# Patient Record
Sex: Female | Born: 1980
Health system: Southern US, Community
[De-identification: ages and names within clinical notes are randomized; demographics above are authoritative.]

## PROBLEM LIST (undated history)

## (undated) DIAGNOSIS — B977 Papillomavirus as the cause of diseases classified elsewhere: Secondary | ICD-10-CM

## (undated) DIAGNOSIS — E785 Hyperlipidemia, unspecified: Secondary | ICD-10-CM

## (undated) DIAGNOSIS — K219 Gastro-esophageal reflux disease without esophagitis: Secondary | ICD-10-CM

## (undated) DIAGNOSIS — M797 Fibromyalgia: Secondary | ICD-10-CM

## (undated) DIAGNOSIS — G8929 Other chronic pain: Secondary | ICD-10-CM

## (undated) DIAGNOSIS — R7301 Impaired fasting glucose: Secondary | ICD-10-CM

## (undated) DIAGNOSIS — R600 Localized edema: Secondary | ICD-10-CM

## (undated) DIAGNOSIS — I10 Essential (primary) hypertension: Secondary | ICD-10-CM

## (undated) DIAGNOSIS — E282 Polycystic ovarian syndrome: Secondary | ICD-10-CM

## (undated) DIAGNOSIS — N920 Excessive and frequent menstruation with regular cycle: Secondary | ICD-10-CM

## (undated) DIAGNOSIS — L680 Hirsutism: Secondary | ICD-10-CM

## (undated) DIAGNOSIS — A159 Respiratory tuberculosis unspecified: Secondary | ICD-10-CM

## (undated) HISTORY — DX: Impaired fasting glucose: R73.01

## (undated) HISTORY — DX: Hyperlipidemia, unspecified: E78.5

## (undated) HISTORY — DX: Hirsutism: L68.0

## (undated) HISTORY — PX: GASTRECTOMY: SHX58

## (undated) HISTORY — PX: LAPAROSCOPIC GASTRIC SLEEVE RESECTION: SHX5895

## (undated) HISTORY — DX: Polycystic ovarian syndrome: E28.2

## (undated) HISTORY — DX: Localized edema: R60.0

## (undated) HISTORY — DX: Essential (primary) hypertension: I10

## (undated) HISTORY — PX: CHOLECYSTECTOMY: SHX55

## (undated) HISTORY — PX: ABDOMINAL SURGERY: SHX537

## (undated) HISTORY — DX: Respiratory tuberculosis unspecified: A15.9

## (undated) HISTORY — DX: Papillomavirus as the cause of diseases classified elsewhere: B97.7

## (undated) HISTORY — DX: Gastro-esophageal reflux disease without esophagitis: K21.9

## (undated) HISTORY — DX: Excessive and frequent menstruation with regular cycle: N92.0

---

## 2012-04-28 ENCOUNTER — Encounter: Payer: Self-pay | Admitting: *Deleted

## 2012-04-30 ENCOUNTER — Ambulatory Visit: Payer: Self-pay | Admitting: Family Medicine

## 2012-05-04 ENCOUNTER — Encounter: Payer: Self-pay | Admitting: Family Medicine

## 2012-05-04 ENCOUNTER — Ambulatory Visit (INDEPENDENT_AMBULATORY_CARE_PROVIDER_SITE_OTHER): Payer: Managed Care, Other (non HMO) | Admitting: Family Medicine

## 2012-05-04 VITALS — BP 115/84 | HR 84 | Wt 226.0 lb

## 2012-05-04 DIAGNOSIS — R110 Nausea: Secondary | ICD-10-CM | POA: Insufficient documentation

## 2012-05-04 DIAGNOSIS — E669 Obesity, unspecified: Secondary | ICD-10-CM | POA: Insufficient documentation

## 2012-05-04 MED ORDER — AMBULATORY NON FORMULARY MEDICATION: Status: DC

## 2012-05-04 NOTE — Patient Instructions (Addendum)
Take 1 tablespoon of the GI cocktail 30 mins prior to drinking a protein shake.     High Protein Diet A high protein diet means that high protein foods are added to your diet. Getting more protein in the diet is important for a number of reasons. Protein helps the body to build tissue, muscle, and to repair damage. People who have had surgery, injuries such as broken bones, infections, and burns, or illnesses such as cancer, may need more protein in their diet.  SERVING SIZES Measuring foods and serving sizes helps to make sure you are getting the right amount of food. The list below tells how big or small some common serving sizes are.   1 oz.........4 stacked dice.  3 oz........Marland KitchenDeck of cards.  1 tsp.......Marland KitchenTip of little finger.  1 tbs......Marland KitchenMarland KitchenThumb.  2 tbs.......Marland KitchenGolf ball.   cup......Marland KitchenHalf of a fist.  1 cup.......Marland KitchenA fist. FOOD SOURCES OF PROTEIN Listed below are some food sources of protein and the amount of protein they contain. Your Registered Dietitian can calculate how many grams of protein you need for your medical condition. High protein foods can be added to the diet at mealtime or as snacks. Be sure to have at least 1 protein-containing food at each meal and snack to ensure adequate intake.  Meats and Meat Substitutes / Protein (g)  3 oz poultry (chicken, [place]) / 26 g  3 oz tuna, canned in water / 26 g  3 oz fish (cod) / 21 g  3 oz red meat (beef, pork) / 21 g  4 oz tofu / 9 g  1 egg / 6 g   cup egg substitute / 5 g  1 cup dried beans / 15 g  1 cup soy milk / 4 g Dairy / Protein (g)  1 cup milk (skim, 1%, 2%, whole) / 8 g   cup evaporated milk / 9 g  1 cup buttermilk / 8 g  1 cup low-fat plain yogurt / 11 g  1 cup regular plain yogurt / 9 g   cup cottage cheese / 14 g  1 oz cheddar cheese / 7 g Nuts / Protein (g)  2 tbs peanut butter / 8 g  1 oz peanuts / 7 g  2 tbs cashews / 5 g  2 tbs almonds / 5 g Document Released: 01/28/2005  Document Revised: 04/22/2011 Document Reviewed: 10/31/2006 [facility] Patient Information 2013 [place], [place].

## 2012-05-04 NOTE — Progress Notes (Signed)
Subjective:     Patient ID: Paige Hamilton, female   DOB: Nov 05, 1980, 32 y.o.   MRN: [id number]  HPI [place] is in for a follow up from her sleeve gastrectomy.  Overall, she has done okay.  She had a harder time than she thought she would. She was hospitalized for 4 days. She has struggled with nausea and has been unable to get in the amount of fluid that she needs.  She is feeling very weak and has been out of work for 3 weeks.    Review of Systems  Constitutional: Positive for fatigue.  Gastrointestinal: Positive for nausea.       Objective:   Physical Exam  Skin:  Her incisions are healing well.    Psychiatric: She has a normal mood and affect.       Assessment:    Nausea  Fatigue     Plan:     We discussed how she needs to get in protein.  She was given a prescription for GI cocktail.

## 2012-05-05 ENCOUNTER — Ambulatory Visit: Payer: Self-pay | Admitting: Family Medicine

## 2012-08-03 ENCOUNTER — Other Ambulatory Visit: Payer: Managed Care, Other (non HMO)

## 2012-08-10 ENCOUNTER — Other Ambulatory Visit: Payer: Managed Care, Other (non HMO) | Admitting: Family Medicine

## 2012-08-11 ENCOUNTER — Other Ambulatory Visit: Payer: Managed Care, Other (non HMO) | Admitting: Family Medicine

## 2012-08-25 ENCOUNTER — Other Ambulatory Visit: Payer: Self-pay | Admitting: *Deleted

## 2012-08-25 DIAGNOSIS — Z0000 Encounter for general adult medical examination without abnormal findings: Secondary | ICD-10-CM

## 2012-08-25 DIAGNOSIS — E559 Vitamin D deficiency, unspecified: Secondary | ICD-10-CM

## 2012-08-26 ENCOUNTER — Other Ambulatory Visit: Payer: Managed Care, Other (non HMO)

## 2012-09-02 ENCOUNTER — Other Ambulatory Visit: Payer: Managed Care, Other (non HMO) | Admitting: Family Medicine

## 2012-09-29 ENCOUNTER — Other Ambulatory Visit: Payer: Managed Care, Other (non HMO) | Admitting: Family Medicine

## 2012-11-05 ENCOUNTER — Encounter: Payer: Self-pay | Admitting: Family Medicine

## 2012-11-05 ENCOUNTER — Ambulatory Visit (INDEPENDENT_AMBULATORY_CARE_PROVIDER_SITE_OTHER): Payer: Managed Care, Other (non HMO) | Admitting: Family Medicine

## 2012-11-05 VITALS — BP 129/83 | HR 81 | Resp 16 | Ht 67.0 in | Wt 222.0 lb

## 2012-11-05 DIAGNOSIS — R5381 Other malaise: Secondary | ICD-10-CM

## 2012-11-05 DIAGNOSIS — F329 Major depressive disorder, single episode, unspecified: Secondary | ICD-10-CM

## 2012-11-05 DIAGNOSIS — E669 Obesity, unspecified: Secondary | ICD-10-CM

## 2012-11-05 MED ORDER — FLUOXETINE HCL 20 MG PO CAPS: 20.0000 mg | ORAL_CAPSULE | Freq: Every day | ORAL | Status: DC

## 2012-11-05 MED ORDER — PANTOPRAZOLE SODIUM 40 MG PO TBEC: 40.0000 mg | DELAYED_RELEASE_TABLET | Freq: Every day | ORAL | Status: DC

## 2012-11-05 MED ORDER — WELLBUTRIN XL 300 MG PO TB24: 300.0000 mg | ORAL_TABLET | ORAL | Status: DC

## 2012-11-05 MED ORDER — WELLBUTRIN XL 150 MG PO TB24: 150.0000 mg | ORAL_TABLET | ORAL | Status: DC

## 2012-11-05 NOTE — Progress Notes (Signed)
Subjective:    Patient ID: Paige Hamilton, female    DOB: May 22, 1980, 32 y.o.   MRN: [id number]  HPI Paige Hamilton is here today to follow up on her weight and to discuss her mood.  She had gastrectomy surgery in March 2014.  Overall, she has done well since her surgery.  She is concerned that she is not progressing as fast as she would like with her weight loss. She says that eating is very uncomfortable and she does not find any joy in eating as she did before.  She has been feeling really depressed lately.  She would like to see a nutritionist.  She has a form that needs to be completed for this.    Review of Systems  Constitutional: Negative.   HENT: Negative.   Eyes: Negative.   Respiratory: Negative.   Cardiovascular: Negative.   Gastrointestinal: Negative.   Endocrine: Negative.   Genitourinary: Negative.   Musculoskeletal: Negative.   Skin: Negative.   Allergic/Immunologic: Negative.   Neurological: Negative.   Hematological: Negative.   Psychiatric/Behavioral:       She has been feeling depressed lately     Past Medical History  Diagnosis Date  . Tuberculosis   . Hyperlipidemia   . Hypertension   . IFG (impaired fasting glucose)   . GERD (gastroesophageal reflux disease)   . Edema leg   . PCOS (polycystic ovarian syndrome)   . Hirsutism   . Menorrhagia   . High risk HPV infection       Family History  Problem Relation Age of Onset  . Heart disease Mother     CVD  . Diabetes type II Mother   . Hypertension Mother   . Hyperlipidemia Mother   . Diabetes type II Father   . Hypertension Father   . Hyperlipidemia Father   . Diabetes type II Brother   . Hypertension Brother   . Asthma      Niece    History   Social History Narrative   Marital Status: Divorced    Children:  None    Pets: Charity fundraiser (JoJo)    Living Situation: Lives alone   Occupation: Environmental education officer (Surveyor, quantity) Economist; Architectural technologist) [employer]     Education: Clinical research associate ([employer])    Alcohol Use:  None   Tobacco Use:  None    Drug Use:  None   Diet:  Regular   Exercise:  Walking    Hobbies: Reading/Cooking/Playing with her nieces and nephews        .   Objective:   Physical Exam  Nursing note and vitals reviewed. Constitutional: She is oriented to person, place, and time. She appears well-developed and well-nourished.  HENT:  Head: Normocephalic.  Eyes: Pupils are equal, round, and reactive to light.  Neck: Normal range of motion.  Cardiovascular: Normal rate.   Pulmonary/Chest: Effort normal.  Abdominal: Soft.  Musculoskeletal: Normal range of motion.  Neurological: She is alert and oriented to person, place, and time.  Skin: Skin is warm and dry.  Psychiatric: Her behavior is normal. Judgment and thought content normal.  She seems pretty depressed      Assessment & Plan:    Paige Hamilton was seen today for mood, energy and weight.    Diagnoses and associated orders for this visit:  Other malaise and fatigue Comments: She is going to try some Wellbutrin.   - WELLBUTRIN XL 150 MG 24 hr tablet; Take 1 tablet (150 mg total) by mouth  every morning. - WELLBUTRIN XL 300 MG 24 hr tablet; Take 1 tablet (300 mg total) by mouth every morning.  Depression, acute Comments: She will start back on Prozac.  - FLUoxetine (PROZAC) 20 MG capsule; Take 1 capsule (20 mg total) by mouth daily.  Obesity, unspecified Comments: A referral was done to send her to a nutritionist.    Other Orders - Discontinue: pantoprazole (PROTONIX) 40 MG tablet; Take 1 tablet (40 mg total) by mouth daily.

## 2012-12-21 ENCOUNTER — Other Ambulatory Visit: Payer: Self-pay | Admitting: *Deleted

## 2012-12-21 DIAGNOSIS — E559 Vitamin D deficiency, unspecified: Secondary | ICD-10-CM

## 2012-12-21 DIAGNOSIS — Z0000 Encounter for general adult medical examination without abnormal findings: Secondary | ICD-10-CM

## 2012-12-22 ENCOUNTER — Other Ambulatory Visit: Payer: Managed Care, Other (non HMO)

## 2012-12-22 LAB — CBC WITH DIFFERENTIAL/PLATELET
Basophils Absolute: 0 10*3/uL (ref 0.0–0.1)
Basophils Relative: 1 % (ref 0–1)
Eosinophils Absolute: 0.1 10*3/uL (ref 0.0–0.7)
Eosinophils Relative: 2 % (ref 0–5)
HCT: 35.1 % — ABNORMAL LOW (ref 36.0–46.0)
Hemoglobin: 12 g/dL (ref 12.0–15.0)
Lymphocytes Relative: 42 % (ref 12–46)
Lymphs Abs: 2.2 10*3/uL (ref 0.7–4.0)
MCH: 27.6 pg (ref 26.0–34.0)
MCHC: 34.2 g/dL (ref 30.0–36.0)
MCV: 80.9 fL (ref 78.0–100.0)
Monocytes Absolute: 0.4 10*3/uL (ref 0.1–1.0)
Monocytes Relative: 7 % (ref 3–12)
Neutro Abs: 2.5 10*3/uL (ref 1.7–7.7)
Neutrophils Relative %: 48 % (ref 43–77)
Platelets: 305 10*3/uL (ref 150–400)
RBC: 4.34 MIL/uL (ref 3.87–5.11)
RDW: 14.9 % (ref 11.5–15.5)
WBC: 5.1 10*3/uL (ref 4.0–10.5)

## 2012-12-22 LAB — COMPLETE METABOLIC PANEL WITH GFR
ALT: 16 U/L (ref 0–35)
AST: 13 U/L (ref 0–37)
Albumin: 4.2 g/dL (ref 3.5–5.2)
Alkaline Phosphatase: 59 U/L (ref 39–117)
BUN: 18 mg/dL (ref 6–23)
CO2: 24 mEq/L (ref 19–32)
Calcium: 8.8 mg/dL (ref 8.4–10.5)
Chloride: 107 mEq/L (ref 96–112)
Creat: 0.55 mg/dL (ref 0.50–1.10)
GFR, Est African American: >89 mL/min
GFR, Est Non African American: >89 mL/min
Glucose, Bld: 87 mg/dL (ref 70–99)
Potassium: 4 mEq/L (ref 3.5–5.3)
Sodium: 140 mEq/L (ref 135–145)
Total Bilirubin: 0.6 mg/dL (ref 0.3–1.2)
Total Protein: 7 g/dL (ref 6.0–8.3)

## 2012-12-22 LAB — LIPID PANEL
Cholesterol: 153 mg/dL (ref 0–200)
HDL: 35 mg/dL — ABNORMAL LOW (ref >39)
LDL Cholesterol: 102 mg/dL — ABNORMAL HIGH (ref 0–99)
Total CHOL/HDL Ratio: 4.4 Ratio
Triglycerides: 78 mg/dL (ref <150)
VLDL: 16 mg/dL (ref 0–40)

## 2012-12-23 LAB — VITAMIN D 25 HYDROXY (VIT D DEFICIENCY, FRACTURES): Vit D, 25-Hydroxy: 40 ng/mL (ref 30–89)

## 2012-12-23 LAB — TSH: TSH: 2.225 u[IU]/mL (ref 0.350–4.500)

## 2012-12-25 ENCOUNTER — Other Ambulatory Visit: Payer: Managed Care, Other (non HMO) | Admitting: Family Medicine

## 2013-01-13 ENCOUNTER — Other Ambulatory Visit: Payer: Managed Care, Other (non HMO) | Admitting: Family Medicine

## 2013-01-15 ENCOUNTER — Other Ambulatory Visit (HOSPITAL_COMMUNITY)
Admission: RE | Admit: 2013-01-15 | Discharge: 2013-01-15 | Disposition: A | Discharge status: Home / Self Care | Payer: Managed Care, Other (non HMO) | Source: Ambulatory Visit | Attending: Family Medicine | Admitting: Family Medicine

## 2013-01-15 ENCOUNTER — Encounter: Payer: Self-pay | Admitting: Family Medicine

## 2013-01-15 ENCOUNTER — Ambulatory Visit (INDEPENDENT_AMBULATORY_CARE_PROVIDER_SITE_OTHER): Payer: Managed Care, Other (non HMO) | Admitting: Family Medicine

## 2013-01-15 ENCOUNTER — Encounter (INDEPENDENT_AMBULATORY_CARE_PROVIDER_SITE_OTHER): Payer: Self-pay

## 2013-01-15 VITALS — BP 114/80 | HR 80 | Resp 16 | Ht 67.0 in | Wt 211.0 lb

## 2013-01-15 DIAGNOSIS — Z0000 Encounter for general adult medical examination without abnormal findings: Secondary | ICD-10-CM

## 2013-01-15 DIAGNOSIS — Z1151 Encounter for screening for human papillomavirus (HPV): Secondary | ICD-10-CM | POA: Insufficient documentation

## 2013-01-15 DIAGNOSIS — Z01419 Encounter for gynecological examination (general) (routine) without abnormal findings: Secondary | ICD-10-CM | POA: Insufficient documentation

## 2013-01-15 LAB — POCT URINALYSIS DIPSTICK
Bilirubin, UA: NEGATIVE
Blood, UA: NEGATIVE
Glucose, UA: NEGATIVE
Ketones, UA: POSITIVE
Leukocytes, UA: NEGATIVE
Nitrite, UA: NEGATIVE
Protein, UA: NEGATIVE
Spec Grav, UA: >=1.03
Urobilinogen, UA: NEGATIVE
pH, UA: 6

## 2013-01-15 NOTE — Progress Notes (Signed)
Subjective:    Patient ID: Paige Hamilton, female    DOB: 1980/08/14, 32 y.o.   MRN: [id number]  HPI  Paige Hamilton is here today for her annual CPE.  She has done well since her last office visit.  She is currently not taking any of her medications.      Review of Systems  Constitutional: Negative for activity change, appetite change, fatigue and unexpected weight change.  HENT: Negative for congestion, dental problem, ear pain, hearing loss, trouble swallowing and voice change.   Eyes: Negative for pain, redness and visual disturbance.  Respiratory: Negative for cough and shortness of breath.   Cardiovascular: Negative for chest pain, palpitations and leg swelling.  Gastrointestinal: Negative for nausea, vomiting, abdominal pain, diarrhea, constipation and blood in stool.  Endocrine: Negative for cold intolerance, heat intolerance, polydipsia, polyphagia and polyuria.  Genitourinary: Negative for dysuria, urgency, frequency, hematuria, vaginal discharge and pelvic pain.  Musculoskeletal: Negative for arthralgias, back pain, joint swelling, myalgias and neck pain.  Skin: Negative for rash.  Neurological: Negative for dizziness, weakness and headaches.  Hematological: Negative for adenopathy. Does not bruise/bleed easily.  Psychiatric/Behavioral: Negative for sleep disturbance, dysphoric mood and decreased concentration. The patient is not nervous/anxious.      Past Medical History  Diagnosis Date  . Tuberculosis   . Hyperlipidemia   . Hypertension   . IFG (impaired fasting glucose)   . GERD (gastroesophageal reflux disease)   . Edema leg   . PCOS (polycystic ovarian syndrome)   . Hirsutism   . Menorrhagia   . High risk HPV infection      Past Surgical History  Procedure Laterality Date  . Laparoscopic gastric sleeve resection       History   Social History Narrative   Marital Status: Divorced    Children:  None    Pets: Charity fundraiser ([employer])    Living Situation: Lives alone   Occupation: Environmental education officer (Surveyor, quantity) Economist; Architectural technologist) [employer]     Education: Higher education careers adviser ([employer])    Alcohol Use:  None   Tobacco Use:  None    Drug Use:  None   Diet:  Regular   Exercise:  Walking    Hobbies: Reading/Cooking/Playing with her nieces and nephews           Family History  Problem Relation Age of Onset  . Heart disease Mother     CVD  . Diabetes type II Mother   . Hypertension Mother   . Hyperlipidemia Mother   . Diabetes type II Father   . Hypertension Father   . Hyperlipidemia Father   . Diabetes type II Brother   . Hypertension Brother   . Asthma      Niece     Current Outpatient Prescriptions on File Prior to Visit  Medication Sig Dispense Refill  . FLUoxetine (PROZAC) 20 MG capsule Take 1 capsule (20 mg total) by mouth daily.  90 capsule  1  . omeprazole (PRILOSEC) 20 MG capsule Take 20 mg by mouth 2 (two) times daily.      . WELLBUTRIN XL 150 MG 24 hr tablet Take 1 tablet (150 mg total) by mouth every morning.  30 tablet  0  . WELLBUTRIN XL 300 MG 24 hr tablet Take 1 tablet (300 mg total) by mouth every morning.  30 tablet  5   No current facility-administered medications on file prior to visit.     No Known Allergies  Objective:   Physical Exam  Vitals reviewed. Constitutional: She is oriented to person, place, and time. She appears well-developed and well-nourished.  HENT:  Head: Normocephalic and atraumatic.  Right Ear: External ear normal.  Left Ear: External ear normal.  Nose: Nose normal.  Mouth/Throat: Oropharynx is clear and moist.  Eyes: Conjunctivae and EOM are normal. Pupils are equal, round, and reactive to light.  Neck: Normal range of motion. No thyromegaly present.  Cardiovascular: Normal rate, regular rhythm, normal heart sounds and intact distal pulses.  Exam reveals no gallop and no friction rub.   No murmur heard. Pulmonary/Chest: Effort normal and breath sounds  normal. Right breast exhibits no inverted nipple, no mass, no nipple discharge, no skin change and no tenderness. Left breast exhibits no inverted nipple, no mass, no nipple discharge, no skin change and no tenderness. Breasts are symmetrical.  Abdominal: Soft. Bowel sounds are normal. Hernia confirmed negative in the right inguinal area and confirmed negative in the left inguinal area.  Genitourinary: Vagina normal and uterus normal. Pelvic exam was performed with patient supine. There is no rash, tenderness or lesion on the right labia. There is no rash, tenderness or lesion on the left labia. No vaginal discharge found.  Musculoskeletal: Normal range of motion. She exhibits no edema and no tenderness.  Lymphadenopathy:    She has no cervical adenopathy.       Right: No inguinal adenopathy present.       Left: No inguinal adenopathy present.  Neurological: She is alert and oriented to person, place, and time. She has normal reflexes.  Skin: Skin is warm and dry.  Psychiatric: She has a normal mood and affect. Her behavior is normal. Judgment and thought content normal.      Assessment & Plan:    Paige Hamilton was seen today for annual exam.  Diagnoses and associated orders for this visit:  Routine general medical examination at a health care facility - POCT urinalysis dipstick - Cytology - PAP

## 2013-01-16 DIAGNOSIS — F32A Depression, unspecified: Secondary | ICD-10-CM | POA: Insufficient documentation

## 2013-01-16 DIAGNOSIS — F329 Major depressive disorder, single episode, unspecified: Secondary | ICD-10-CM | POA: Insufficient documentation

## 2013-01-16 DIAGNOSIS — R5381 Other malaise: Secondary | ICD-10-CM | POA: Insufficient documentation

## 2013-05-10 ENCOUNTER — Ambulatory Visit: Payer: Managed Care, Other (non HMO) | Admitting: Family Medicine

## 2016-07-17 ENCOUNTER — Encounter (HOSPITAL_BASED_OUTPATIENT_CLINIC_OR_DEPARTMENT_OTHER): Payer: Self-pay

## 2016-07-17 ENCOUNTER — Emergency Department (HOSPITAL_BASED_OUTPATIENT_CLINIC_OR_DEPARTMENT_OTHER)
Admission: EM | Admit: 2016-07-17 | Discharge: 2016-07-17 | Disposition: A | Discharge status: Home / Self Care | Payer: Managed Care, Other (non HMO) | Attending: Emergency Medicine | Admitting: Emergency Medicine

## 2016-07-17 DIAGNOSIS — Z79899 Other long term (current) drug therapy: Secondary | ICD-10-CM | POA: Diagnosis not present

## 2016-07-17 DIAGNOSIS — G8929 Other chronic pain: Secondary | ICD-10-CM | POA: Insufficient documentation

## 2016-07-17 DIAGNOSIS — I10 Essential (primary) hypertension: Secondary | ICD-10-CM | POA: Insufficient documentation

## 2016-07-17 DIAGNOSIS — R51 Headache: Secondary | ICD-10-CM | POA: Insufficient documentation

## 2016-07-17 DIAGNOSIS — R519 Headache, unspecified: Secondary | ICD-10-CM

## 2016-07-17 HISTORY — DX: Other chronic pain: G89.29

## 2016-07-17 LAB — PREGNANCY, URINE: PREG TEST UR: NEGATIVE

## 2016-07-17 MED ORDER — DIPHENHYDRAMINE HCL 50 MG/ML IJ SOLN
25.0000 mg | Freq: Once | INTRAMUSCULAR | Status: AC
  Administered 2016-07-17: 25 mg via INTRAVENOUS
  Filled 2016-07-17: qty 1

## 2016-07-17 MED ORDER — SODIUM CHLORIDE 0.9 % IV BOLUS (SEPSIS)
500.0000 mL | Freq: Once | INTRAVENOUS | Status: AC
  Administered 2016-07-17: 500 mL via INTRAVENOUS

## 2016-07-17 MED ORDER — METOCLOPRAMIDE HCL 5 MG/ML IJ SOLN
10.0000 mg | Freq: Once | INTRAMUSCULAR | Status: AC
  Administered 2016-07-17: 10 mg via INTRAVENOUS
  Filled 2016-07-17: qty 2

## 2016-07-17 MED ORDER — SODIUM CHLORIDE 0.9 % IV BOLUS (SEPSIS)
1000.0000 mL | Freq: Once | INTRAVENOUS | Status: AC
  Administered 2016-07-17: 1000 mL via INTRAVENOUS

## 2016-07-17 MED ORDER — HYDROCODONE-ACETAMINOPHEN 5-325 MG PO TABS: 2.0000 | ORAL_TABLET | ORAL | 0 refills | Status: DC | PRN

## 2016-07-17 NOTE — ED Provider Notes (Signed)
[facility]-EMERGENCY DEPT Provider Note   CSN: [id number] Arrival date & time: 07/17/16  1127     History   Chief Complaint Chief Complaint  Patient presents with  . Headache    HPI Paige Hamilton is a 36 y.o. female who presents with Gradually worsening generalized headache that began last night. Patient states that the headache is generalized across her entire scalp and goes down posteriorly to neck. Patient states that she was at home when the symptoms began. She attempted to go to sleep to help relieve the pain but she was unable to sleep. Patient states that she took her normal medication Cymbalta and tizanidine to help with pain but did not have much improvement. Patient reports that she took a Excedrin Migraine this morning but did not have improvement in symptoms. Patient does not have a history of migraines. She says that she sometimes gets headaches but that they usually relieved when she takes her Cymbalta. She has not had any difficulty in relating. She denies any preceding trauma or injury prior to onset of headache. She denies any history of a thunderclap headache. She denies any vision changes, numbness/weakness of her arms or legs, nausea/vomiting, dizziness, photophobia. Patient also reports that she has a history of restless leg syndrome and states that over the last few weeks for restless leg syndrome and has gotten worse despite her taking her medications. She feels like the current medication Requip that she is on is not working. She states that she feels like her legs are "just moving and burning and hurt." She has not talked her primary care doctor regarding his symptoms. Patient denies any chest pain, fever, difficulty breathing, abdominal pain, dysuria, hematuria.  The history is provided by the patient.    Past Medical History:  Diagnosis Date  . Chronic pain   . Edema leg   . GERD (gastroesophageal reflux disease)   . High risk HPV infection   . Hirsutism   .  Hyperlipidemia   . Hypertension   . IFG (impaired fasting glucose)   . Menorrhagia   . PCOS (polycystic ovarian syndrome)   . Tuberculosis     Patient Active Problem List   Diagnosis Date Noted  . Other malaise and fatigue 01/16/2013  . Depression, acute 01/16/2013  . Nausea alone 05/04/2012  . Obesity, unspecified 05/04/2012    Past Surgical History:  Procedure Laterality Date  . LAPAROSCOPIC GASTRIC SLEEVE RESECTION      OB History    No data available       Home Medications    Prior to Admission medications   Medication Sig Start Date End Date Taking? Authorizing Provider  DULoxetine HCl (CYMBALTA PO) Take by mouth.   Yes [provider]  Lisdexamfetamine Dimesylate (VYVANSE PO) Take by mouth.   Yes [provider]  ROPINIRole HCl (REQUIP PO) Take by mouth.   Yes [provider]  TIZANIDINE HCL PO Take by mouth.   Yes [provider]    Family History Family History  Problem Relation Age of Onset  . Heart disease Mother        CVD  . Diabetes type II Mother   . Hypertension Mother   . Hyperlipidemia Mother   . Diabetes type II Father   . Hypertension Father   . Hyperlipidemia Father   . Diabetes type II Brother   . Hypertension Brother   . Asthma Unknown        Niece    Social History Social  History  Substance Use Topics  . Smoking status: Never Smoker  . Smokeless tobacco: Never Used  . Alcohol use No     Allergies   Ibuprofen and Naproxen   Review of Systems Review of Systems  Constitutional: Negative for chills and fever.  Eyes: Negative for photophobia and visual disturbance.  Respiratory: Negative for shortness of breath.   Cardiovascular: Negative for chest pain.  Gastrointestinal: Negative for abdominal pain, diarrhea, nausea and vomiting.  Genitourinary: Negative for dysuria and hematuria.  Musculoskeletal: Negative for back pain and neck pain.       Leg pain (chronic)   Skin: Negative for  rash.  Neurological: Positive for headaches. Negative for dizziness, weakness and numbness.  All other systems reviewed and are negative.    Physical Exam Updated Vital Signs BP 128/82 (BP Location: Left Arm)   Pulse 61   Temp 98.7 F (37.1 C) (Oral)   Resp 16   Ht 5\' 9"  (1.753 m)   Wt 99.3 kg (219 lb)   LMP 07/11/2016   SpO2 100%   BMI 32.34 kg/m   Physical Exam  Constitutional: She is oriented to person, place, and time. She appears well-developed and well-nourished.  Laying on examination table, appears uncomfortable but not acute distress   HENT:  Head: Normocephalic and atraumatic.  Mouth/Throat: Oropharynx is clear and moist and mucous membranes are normal.  No tenderness to palpation of scalp. No deformities or crepitus noted.  Eyes: Conjunctivae, EOM and lids are normal. Pupils are equal, round, and reactive to light. Right eye exhibits no nystagmus. Left eye exhibits no nystagmus.  Neck: Full passive range of motion without pain.  Full flexion/extension and lateral movement of neck fully intact. No bony midline tenderness. No deformities or crepitus.   Cardiovascular: Normal rate, regular rhythm, normal heart sounds and normal pulses.  Exam reveals no gallop and no friction rub.   No murmur heard. Pulmonary/Chest: Effort normal and breath sounds normal.  Abdominal: Soft. Normal appearance. There is no tenderness. There is no rigidity and no guarding.  Musculoskeletal: Normal range of motion.  Neurological: She is alert and oriented to person, place, and time.  Cranial nerves III-XII intact Follows commands, Moves all extremities  5/5 strength to BUE and BLE  Sensation intact throughout  Initially was very tremulous on finger to nose but patient was not looking when she attempts to complete. Told patient to redirect and actually watch while she was doing it and she had normal finger to nose. No dysdiadochokinesia. No pronator drift. No gait abnormalities  No  slurred speech. No facial droop.   Skin: Skin is warm and dry. Capillary refill takes less than 2 seconds.  Psychiatric: She has a normal mood and affect. Her speech is normal.  Nursing note and vitals reviewed.    ED Treatments / Results  Labs (all labs ordered are listed, but only abnormal results are displayed) Labs Reviewed  PREGNANCY, URINE    EKG  EKG Interpretation None       Radiology No results found.  Procedures Procedures (including critical care time)  Medications Ordered in ED Medications  sodium chloride 0.9 % bolus 1,000 mL (0 mLs Intravenous Stopped 07/17/16 1357)  metoCLOPramide (REGLAN) injection 10 mg (10 mg Intravenous Given 07/17/16 1323)  diphenhydrAMINE (BENADRYL) injection 25 mg (25 mg Intravenous Given 07/17/16 1320)  sodium chloride 0.9 % bolus 500 mL (0 mLs Intravenous Stopped 07/17/16 1535)     Initial Impression / Assessment and Plan / ED Course  I have reviewed the triage vital signs and the nursing notes.  Pertinent labs & imaging results that were available during my care of the patient were reviewed by me and considered in my medical decision making (see chart for details).     36 year old female with past medical history of chronic pain, depression who presents with a generalized headache that began last night. States that it has continued to gradually worsened since onset. Not relieved with her Cymbalta or Excedrin. Patient is afebrile, non-toxic appearing, sitting comfortably on examination table. No neuro deficits on exam. No red flag warning symptoms and history. No indications for imaging at this time. No history of migraines. No history of fever, recent illness, trauma or injury. History/physical exam are not concerning for meningitis as patient has a supple and nontender neck. No rigidity noted. No meningismal signs. History/physical exam are not concerning for [facility] or [facility]. Patient has an allergy to ibuprofen. We'll give analgesics in the  department to help with symptomatic relief. IVF given for fluid resuscitation.  Patient reports improvement in headache after initial medications and fluids. Patient states that is improved to a 5/10. Patient has been able to eat while being in the department without any difficulty. Additional fluids ordered.  Reevaluation: Patient reports improvement after fluid resuscitation and pain medications. She still has a slight headache but states that it is significantly improved since onset of symptoms. Patient has been able to a bili in the department without any difficulty. She feels like she can go home. Patient still with no neuro deficits on exam. Stable for discharge at this time. Will provide a short course of pain medications to help with the symptoms.  Patient is currently being followed by pain management for symptoms of shoulder pain, which she did not initially disclose. Unclear if she is on a pain contract. Explained to patient that if she is with pain management that I cannot give her any narcotics to go home with as it may violate any potential pain management contract. She is agreeable to this and states that she is feeling better and does not think she needs the pain medications. Instructed patient to follow-up with her primary care doctor in the next 24-48 hours for further evaluation. Strict return precautions discussed. Patient expresses understanding and agreement to plan.   Final Clinical Impressions(s) / ED Diagnoses   Final diagnoses:  Acute nonintractable headache, unspecified headache type    New Prescriptions Discharge Medication List as of 07/17/2016  4:04 PM    START taking these medications   Details  HYDROcodone-acetaminophen (NORCO/VICODIN) 5-325 MG tablet Take 2 tablets by mouth every 4 (four) hours as needed., Starting Wed 07/17/2016, Print         Maxwell Caul, PA-C 07/18/16 1249    Doug Sou, MD 07/18/16 [phone]

## 2016-07-17 NOTE — ED Notes (Signed)
ED Provider at bedside. 

## 2016-07-17 NOTE — ED Notes (Signed)
ED Provider at bedside discussing plan of care for dispo 

## 2016-07-17 NOTE — ED Triage Notes (Signed)
C/o HA started last night-posterior neck pain and leg pain x today-states she sees pain management for neck and shoulder pain-was not able to be seen today-NAD-steady gait

## 2016-07-17 NOTE — Discharge Instructions (Signed)
Follow-up with her primary care doctor in the next 24-48 hours for further evaluation  Take the pain medication as directed.  Make sure you aren't draining plan fluids and staying hydrated.  Return the emergency Department for any worsening headache despite medication use, fever, difficulty walking, numbness/weakness of the arms or legs, persistent nausea and vomiting, vision changes or any other worsening or concerning symptoms.

## 2016-10-26 ENCOUNTER — Encounter (HOSPITAL_BASED_OUTPATIENT_CLINIC_OR_DEPARTMENT_OTHER): Payer: Self-pay

## 2016-10-26 ENCOUNTER — Emergency Department (HOSPITAL_BASED_OUTPATIENT_CLINIC_OR_DEPARTMENT_OTHER)
Admission: EM | Admit: 2016-10-26 | Discharge: 2016-10-26 | Disposition: A | Discharge status: Home / Self Care | Payer: Managed Care, Other (non HMO) | Attending: Emergency Medicine | Admitting: Emergency Medicine

## 2016-10-26 DIAGNOSIS — R1013 Epigastric pain: Secondary | ICD-10-CM

## 2016-10-26 DIAGNOSIS — R6889 Other general symptoms and signs: Secondary | ICD-10-CM

## 2016-10-26 DIAGNOSIS — I10 Essential (primary) hypertension: Secondary | ICD-10-CM | POA: Insufficient documentation

## 2016-10-26 DIAGNOSIS — J111 Influenza due to unidentified influenza virus with other respiratory manifestations: Secondary | ICD-10-CM | POA: Insufficient documentation

## 2016-10-26 DIAGNOSIS — Z79899 Other long term (current) drug therapy: Secondary | ICD-10-CM | POA: Diagnosis not present

## 2016-10-26 LAB — URINALYSIS, ROUTINE W REFLEX MICROSCOPIC
BILIRUBIN URINE: NEGATIVE
GLUCOSE, UA: NEGATIVE mg/dL
HGB URINE DIPSTICK: NEGATIVE
KETONES UR: NEGATIVE mg/dL
Nitrite: NEGATIVE
PH: 5.5 (ref 5.0–8.0)
PROTEIN: NEGATIVE mg/dL
Specific Gravity, Urine: 1.025 (ref 1.005–1.030)

## 2016-10-26 LAB — CBC WITH DIFFERENTIAL/PLATELET
Basophils Absolute: 0 10*3/uL (ref 0.0–0.1)
Basophils Relative: 0 %
EOS PCT: 5 %
Eosinophils Absolute: 0.3 10*3/uL (ref 0.0–0.7)
HCT: 31.2 % — ABNORMAL LOW (ref 36.0–46.0)
HEMOGLOBIN: 10.2 g/dL — AB (ref 12.0–15.0)
LYMPHS PCT: 30 %
Lymphs Abs: 2.1 10*3/uL (ref 0.7–4.0)
MCH: 24.8 pg — ABNORMAL LOW (ref 26.0–34.0)
MCHC: 32.7 g/dL (ref 30.0–36.0)
MCV: 75.9 fL — AB (ref 78.0–100.0)
Monocytes Absolute: 0.6 10*3/uL (ref 0.1–1.0)
Monocytes Relative: 8 %
NEUTROS PCT: 57 %
Neutro Abs: 3.9 10*3/uL (ref 1.7–7.7)
PLATELETS: 292 10*3/uL (ref 150–400)
RBC: 4.11 MIL/uL (ref 3.87–5.11)
RDW: 16.5 % — ABNORMAL HIGH (ref 11.5–15.5)
WBC: 6.8 10*3/uL (ref 4.0–10.5)

## 2016-10-26 LAB — COMPREHENSIVE METABOLIC PANEL
ALK PHOS: 71 U/L (ref 38–126)
ALT: 20 U/L (ref 14–54)
AST: 20 U/L (ref 15–41)
Albumin: 3.7 g/dL (ref 3.5–5.0)
Anion gap: 5 (ref 5–15)
BUN: 13 mg/dL (ref 6–20)
CHLORIDE: 105 mmol/L (ref 101–111)
CO2: 26 mmol/L (ref 22–32)
CREATININE: 0.47 mg/dL (ref 0.44–1.00)
Calcium: 8.4 mg/dL — ABNORMAL LOW (ref 8.9–10.3)
GFR calc Af Amer: >60 mL/min (ref >60)
Glucose, Bld: 94 mg/dL (ref 65–99)
Potassium: 3.7 mmol/L (ref 3.5–5.1)
Sodium: 136 mmol/L (ref 135–145)
Total Bilirubin: 0.3 mg/dL (ref 0.3–1.2)
Total Protein: 6.4 g/dL — ABNORMAL LOW (ref 6.5–8.1)

## 2016-10-26 LAB — URINALYSIS, MICROSCOPIC (REFLEX): RBC / HPF: NONE SEEN RBC/hpf (ref 0–5)

## 2016-10-26 LAB — LIPASE, BLOOD: LIPASE: 33 U/L (ref 11–51)

## 2016-10-26 LAB — PREGNANCY, URINE: Preg Test, Ur: NEGATIVE

## 2016-10-26 MED ORDER — ONDANSETRON HCL 4 MG/2ML IJ SOLN
4.0000 mg | Freq: Once | INTRAMUSCULAR | Status: AC
  Administered 2016-10-26: 4 mg via INTRAVENOUS
  Filled 2016-10-26: qty 2

## 2016-10-26 MED ORDER — ACETAMINOPHEN 500 MG PO TABS
1000.0000 mg | ORAL_TABLET | Freq: Once | ORAL | Status: AC
  Administered 2016-10-26: 1000 mg via ORAL
  Filled 2016-10-26: qty 2

## 2016-10-26 MED ORDER — SODIUM CHLORIDE 0.9 % IV BOLUS (SEPSIS)
1000.0000 mL | Freq: Once | INTRAVENOUS | Status: AC
  Administered 2016-10-26: 1000 mL via INTRAVENOUS

## 2016-10-26 MED ORDER — GI COCKTAIL ~~LOC~~
30.0000 mL | Freq: Once | ORAL | Status: AC
  Administered 2016-10-26: 30 mL via ORAL
  Filled 2016-10-26: qty 30

## 2016-10-26 NOTE — ED Triage Notes (Signed)
Patient reports abdominal pain with dizziness, body aches and nausea starting two days ago. Patient reports abdominal pain upon eating.

## 2016-10-26 NOTE — ED Provider Notes (Signed)
[facility] Provider Note   CSN: [id number] Arrival date & time: 10/26/16  1141     History   Chief Complaint Chief Complaint  Patient presents with  . Abdominal Pain    HPI Paige Hamilton is a 36 y.o. female.   Fever   This is a new problem. Episode onset: 3-4 days. Episode frequency: intermittent. The problem has not changed since onset.Her temperature was unmeasured prior to arrival. Associated symptoms include congestion, headaches, sore throat, muscle aches and cough. Pertinent negatives include no chest pain, no diarrhea and no vomiting. She has tried acetaminophen for the symptoms. The treatment provided mild relief.  Abdominal Pain   This is a new problem. Episode onset: 1 week. The problem occurs constantly. The problem has not changed since onset.The pain is associated with eating. The pain is located in the epigastric region. The quality of the pain is aching. The pain is moderate. Associated symptoms include fever and headaches. Pertinent negatives include diarrhea and vomiting. The symptoms are aggravated by eating. Nothing relieves the symptoms.   Have not eaten in two days due to being scared to make pain worse.   Past Medical History:  Diagnosis Date  . Chronic pain   . Edema leg   . GERD (gastroesophageal reflux disease)   . High risk HPV infection   . Hirsutism   . Hyperlipidemia   . Hypertension   . IFG (impaired fasting glucose)   . Menorrhagia   . PCOS (polycystic ovarian syndrome)   . Tuberculosis     Patient Active Problem List   Diagnosis Date Noted  . Other malaise and fatigue 01/16/2013  . Depression, acute 01/16/2013  . Nausea alone 05/04/2012  . Obesity, unspecified 05/04/2012    Past Surgical History:  Procedure Laterality Date  . LAPAROSCOPIC GASTRIC SLEEVE RESECTION      OB History    No data available       Home Medications    Prior to Admission medications   Medication Sig Start Date End Date Taking?  Authorizing Provider  DULoxetine HCl (CYMBALTA PO) Take by mouth.   Yes [provider]  Lisdexamfetamine Dimesylate (VYVANSE PO) Take by mouth.   Yes [provider]  ROPINIRole HCl (REQUIP PO) Take by mouth.   Yes [provider]  TIZANIDINE HCL PO Take by mouth.   Yes [provider]    Family History Family History  Problem Relation Age of Onset  . Heart disease Mother        CVD  . Diabetes type II Mother   . Hypertension Mother   . Hyperlipidemia Mother   . Diabetes type II Father   . Hypertension Father   . Hyperlipidemia Father   . Diabetes type II Brother   . Hypertension Brother   . Asthma Unknown        Niece    Social History Social History  Substance Use Topics  . Smoking status: Never Smoker  . Smokeless tobacco: Never Used  . Alcohol use No     Allergies   Ibuprofen and Naproxen   Review of Systems Review of Systems  Constitutional: Positive for fever.  HENT: Positive for congestion and sore throat.   Respiratory: Positive for cough.   Cardiovascular: Negative for chest pain.  Gastrointestinal: Positive for abdominal pain. Negative for diarrhea and vomiting.  Neurological: Positive for headaches.  All other systems are reviewed and are negative for acute change except as noted in the HPI  Physical Exam Updated Vital Signs BP 132/82 (BP Location: Left Arm)   Pulse 96   Temp 99.2 F (37.3 C) (Oral)   Resp 20   LMP 10/19/2016   SpO2 97%   Physical Exam  Constitutional: She is oriented to person, place, and time. She appears well-developed and well-nourished. She appears ill. No distress.  HENT:  Head: Normocephalic and atraumatic.  Nose: Mucosal edema present.  Mouth/Throat: Mucous membranes are normal. Posterior oropharyngeal erythema (mild; with PND) present. No oropharyngeal exudate or posterior oropharyngeal edema. No tonsillar exudate.  Eyes: Pupils are equal, round, and reactive to light.  Conjunctivae and EOM are normal. Right eye exhibits no discharge. Left eye exhibits no discharge. No scleral icterus.  Neck: Normal range of motion. Neck supple.  Cardiovascular: Normal rate and regular rhythm.  Exam reveals no gallop and no friction rub.   No murmur heard. Pulmonary/Chest: Effort normal and breath sounds normal. No stridor. No respiratory distress. She has no rales.  Abdominal: Soft. She exhibits no distension. There is tenderness in the epigastric area. There is no rigidity, no rebound, no guarding, no CVA tenderness and negative Murphy's sign. No hernia.  Musculoskeletal: She exhibits no edema or tenderness.  Neurological: She is alert and oriented to person, place, and time.  Skin: Skin is warm and dry. No rash noted. She is not diaphoretic. No erythema.  Psychiatric: She has a normal mood and affect.  Vitals reviewed.    ED Treatments / Results  Labs (all labs ordered are listed, but only abnormal results are displayed) Labs Reviewed  URINALYSIS, ROUTINE W REFLEX MICROSCOPIC - Abnormal; Notable for the following:       Result Value   Leukocytes, UA SMALL (*)    All other components within normal limits  URINALYSIS, MICROSCOPIC (REFLEX) - Abnormal; Notable for the following:    Bacteria, UA MANY (*)    Squamous Epithelial / LPF 6-30 (*)    All other components within normal limits  CBC WITH DIFFERENTIAL/PLATELET - Abnormal; Notable for the following:    Hemoglobin 10.2 (*)    HCT 31.2 (*)    MCV 75.9 (*)    MCH 24.8 (*)    RDW 16.5 (*)    All other components within normal limits  COMPREHENSIVE METABOLIC PANEL - Abnormal; Notable for the following:    Calcium 8.4 (*)    Total Protein 6.4 (*)    All other components within normal limits  PREGNANCY, URINE  LIPASE, BLOOD    EKG  EKG Interpretation None       Radiology No results found.  Procedures Procedures (including critical care time)  Medications Ordered in ED Medications  sodium chloride  0.9 % bolus 1,000 mL (0 mLs Intravenous Stopped 10/26/16 1438)  ondansetron (ZOFRAN) injection 4 mg (4 mg Intravenous Given 10/26/16 1404)  acetaminophen (TYLENOL) tablet 1,000 mg (1,000 mg Oral Given 10/26/16 1404)  gi cocktail (Maalox,Lidocaine,Donnatal) (30 mLs Oral Given 10/26/16 1404)     Initial Impression / Assessment and Plan / ED Course  I have reviewed the triage vital signs and the nursing notes.  Pertinent labs & imaging results that were available during my care of the patient were reviewed by me and considered in my medical decision making (see chart for details).     36 y.o. female presents with flu-like symptoms for 3 days. adequate oral hydration. Rest of history as above.  Patient appears well. No signs of toxicity, patient is interactive. No hypoxia, tachypnea or other signs  of respiratory distress. No sign of clinical dehydration. Lung exam clear. Rest of exam as above.  Most consistent with flu-like illness   No evidence suggestive of pharyngitis, AOM, PNA, or meningitis.  Chest x-ray not indicated at this time.  Epigastric discomfort likely secondary to gastritis. Labs grossly reassuring without evidence of pancreatitis or biliary obstruction. Patient provided with GI cocktail which provided significant improvement in her symptomatology.  Discussed symptomatic treatment with the patient and they will follow closely with their PCP.    Final Clinical Impressions(s) / ED Diagnoses   Final diagnoses:  Influenza-like symptoms  Epigastric pain   Disposition: Discharge  Condition: Good  I have discussed the results, Dx and Tx plan with the patient who expressed understanding and agree(s) with the plan. Discharge instructions discussed at great length. The patient was given strict return precautions who verbalized understanding of the instructions. No further questions at time of discharge.    New Prescriptions   No medications on file    Follow Up: Gillian Scarce, MD [address] [place] [address] [phone]  Schedule an appointment as soon as possible for a visit  As needed      Cardama, Amadeo Garnet, MD 10/26/16 1555

## 2017-03-14 ENCOUNTER — Emergency Department (HOSPITAL_BASED_OUTPATIENT_CLINIC_OR_DEPARTMENT_OTHER): Payer: Managed Care, Other (non HMO)

## 2017-03-14 ENCOUNTER — Emergency Department (HOSPITAL_BASED_OUTPATIENT_CLINIC_OR_DEPARTMENT_OTHER)
Admission: EM | Admit: 2017-03-14 | Discharge: 2017-03-14 | Disposition: A | Discharge status: Home / Self Care | Payer: Managed Care, Other (non HMO) | Attending: Emergency Medicine | Admitting: Emergency Medicine

## 2017-03-14 ENCOUNTER — Encounter (HOSPITAL_BASED_OUTPATIENT_CLINIC_OR_DEPARTMENT_OTHER): Payer: Self-pay

## 2017-03-14 ENCOUNTER — Other Ambulatory Visit: Payer: Self-pay

## 2017-03-14 DIAGNOSIS — I10 Essential (primary) hypertension: Secondary | ICD-10-CM | POA: Diagnosis not present

## 2017-03-14 DIAGNOSIS — Z79899 Other long term (current) drug therapy: Secondary | ICD-10-CM | POA: Insufficient documentation

## 2017-03-14 DIAGNOSIS — R1084 Generalized abdominal pain: Secondary | ICD-10-CM

## 2017-03-14 LAB — CBC WITH DIFFERENTIAL/PLATELET
BASOS ABS: 0.1 10*3/uL (ref 0.0–0.1)
Basophils Relative: 1 %
EOS ABS: 0.1 10*3/uL (ref 0.0–0.7)
Eosinophils Relative: 1 %
HEMATOCRIT: 34.6 % — AB (ref 36.0–46.0)
Hemoglobin: 11.5 g/dL — ABNORMAL LOW (ref 12.0–15.0)
LYMPHS PCT: 35 %
Lymphs Abs: 3.3 10*3/uL (ref 0.7–4.0)
MCH: 27.4 pg (ref 26.0–34.0)
MCHC: 33.2 g/dL (ref 30.0–36.0)
MCV: 82.4 fL (ref 78.0–100.0)
MONOS PCT: 7 %
Monocytes Absolute: 0.7 10*3/uL (ref 0.1–1.0)
NEUTROS ABS: 5.1 10*3/uL (ref 1.7–7.7)
NEUTROS PCT: 56 %
Platelets: 293 10*3/uL (ref 150–400)
RBC: 4.2 MIL/uL (ref 3.87–5.11)
RDW: 15.2 % (ref 11.5–15.5)
WBC: 9.3 10*3/uL (ref 4.0–10.5)

## 2017-03-14 LAB — COMPREHENSIVE METABOLIC PANEL
ALBUMIN: 3.8 g/dL (ref 3.5–5.0)
ALT: 26 U/L (ref 14–54)
ANION GAP: 6 (ref 5–15)
AST: 25 U/L (ref 15–41)
Alkaline Phosphatase: 79 U/L (ref 38–126)
BILIRUBIN TOTAL: 0.2 mg/dL — AB (ref 0.3–1.2)
BUN: 13 mg/dL (ref 6–20)
CALCIUM: 8.6 mg/dL — AB (ref 8.9–10.3)
CO2: 23 mmol/L (ref 22–32)
CREATININE: 0.46 mg/dL (ref 0.44–1.00)
Chloride: 105 mmol/L (ref 101–111)
GFR calc Af Amer: >60 mL/min (ref >60)
GFR calc non Af Amer: >60 mL/min (ref >60)
Glucose, Bld: 118 mg/dL — ABNORMAL HIGH (ref 65–99)
Potassium: 3.6 mmol/L (ref 3.5–5.1)
SODIUM: 134 mmol/L — AB (ref 135–145)
TOTAL PROTEIN: 7.1 g/dL (ref 6.5–8.1)

## 2017-03-14 LAB — URINALYSIS, ROUTINE W REFLEX MICROSCOPIC
Bilirubin Urine: NEGATIVE
GLUCOSE, UA: NEGATIVE mg/dL
HGB URINE DIPSTICK: NEGATIVE
Ketones, ur: 15 mg/dL — AB
Nitrite: NEGATIVE
PROTEIN: NEGATIVE mg/dL
pH: 5.5 (ref 5.0–8.0)

## 2017-03-14 LAB — LIPASE, BLOOD: Lipase: 34 U/L (ref 11–51)

## 2017-03-14 LAB — URINALYSIS, MICROSCOPIC (REFLEX)

## 2017-03-14 LAB — TROPONIN I: Troponin I: <0.03 ng/mL (ref <0.03)

## 2017-03-14 LAB — PREGNANCY, URINE: PREG TEST UR: NEGATIVE

## 2017-03-14 MED ORDER — SODIUM CHLORIDE 0.9 % IV BOLUS (SEPSIS)
1000.0000 mL | Freq: Once | INTRAVENOUS | Status: AC
  Administered 2017-03-14: 1000 mL via INTRAVENOUS

## 2017-03-14 MED ORDER — MORPHINE SULFATE (PF) 4 MG/ML IV SOLN
4.0000 mg | Freq: Once | INTRAVENOUS | Status: AC
  Administered 2017-03-14: 4 mg via INTRAVENOUS
  Filled 2017-03-14: qty 1

## 2017-03-14 MED ORDER — IOPAMIDOL (ISOVUE-300) INJECTION 61%
100.0000 mL | Freq: Once | INTRAVENOUS | Status: AC | PRN
  Administered 2017-03-14: 100 mL via INTRAVENOUS

## 2017-03-14 MED ORDER — ONDANSETRON 4 MG PO TBDP: 4.0000 mg | ORAL_TABLET | Freq: Three times a day (TID) | ORAL | 0 refills | Status: DC | PRN

## 2017-03-14 MED ORDER — FAMOTIDINE 20 MG PO TABS: 20.0000 mg | ORAL_TABLET | Freq: Two times a day (BID) | ORAL | 0 refills | Status: DC

## 2017-03-14 MED ORDER — OMEPRAZOLE 20 MG PO CPDR: 20.0000 mg | DELAYED_RELEASE_CAPSULE | Freq: Every day | ORAL | 1 refills | Status: DC

## 2017-03-14 MED ORDER — ONDANSETRON HCL 4 MG/2ML IJ SOLN
4.0000 mg | Freq: Once | INTRAMUSCULAR | Status: AC
  Administered 2017-03-14: 4 mg via INTRAVENOUS
  Filled 2017-03-14: qty 2

## 2017-03-14 MED ORDER — DICYCLOMINE HCL 20 MG PO TABS: 20.0000 mg | ORAL_TABLET | Freq: Two times a day (BID) | ORAL | 0 refills | Status: DC

## 2017-03-14 MED FILL — ONDANSETRON ODT 4 MG TABLET: 4 | 6 days supply | Qty: 20 | Fill #0

## 2017-03-14 MED FILL — FAMOTIDINE 20 MG TABLET: 20 | 5 days supply | Qty: 10 | Fill #0

## 2017-03-14 MED FILL — OMEPRAZOLE 20 MG CAP: 20 | 30 days supply | Qty: 30 | Fill #0

## 2017-03-14 MED FILL — DICYCLOMINE 20 MG TABLET: 20 | 10 days supply | Qty: 20 | Fill #0

## 2017-03-14 NOTE — ED Notes (Signed)
ED Provider at bedside. 

## 2017-03-14 NOTE — Discharge Instructions (Signed)
°  Diet: Please adhere to the enclosed dietary suggestions.  In general, avoid NSAIDs (i.e. ibuprofen, naproxen, etc.), caffeine, alcohol, spicy foods, fatty foods, or any other foods that seem to cause your symptoms to arise.  Prilosec: Take this medication daily, 20-30 minutes prior to your first meal, for the next 8 weeks.  Continue to take this medication even if you begin to feel better.  Pepcid: Take this medication twice a day for the next 5 days.  Bentyl: May take the Bentyl, as needed, or abdominal discomfort.  Zofran: May use Zofran, as needed, for nausea/vomiting.  Follow-up: Please follow-up with your primary care provider on this matter.  Return: Return to the ED for significantly worsening symptoms, persistent vomiting, persistent fever, vomiting blood, blood in the stools, dark stools, or any other major concerns.

## 2017-03-14 NOTE — ED Provider Notes (Signed)
[facility] EMERGENCY DEPARTMENT Provider Note   CSN: [id number] Arrival date & time: 03/14/17  1404     History   Chief Complaint Chief Complaint  Patient presents with  . Abdominal Pain    HPI Paige Hamilton is a 37 y.o. female.  HPI   Paige Hamilton is a 37 y.o. female, with a history of appendectomy, cholecystectomy, gastric sleeve resection, HTN, hyperlipidemia, GERD, presenting to the ED with abdominal pain and constipation increasing over the last week. Accompanied by abdominal distension, nausea, nonbilious nonbloody vomiting, and anorexia over the last few days.  Pain is generalized, feels like bloating, 8/10. Last normal bowel movement was about 6 days ago.  Patient has had small, hard, intermittent bowel movements since that time.  Use a suppository laxative yesterday and had watery diarrhea since then, but without relief of her other symptoms. Denies fever, urinary complaints, hematemesis, hematochezia, or any other complaints.  Most recent surgery was at least 5 years ago.     Past Medical History:  Diagnosis Date  . Chronic pain   . Edema leg   . GERD (gastroesophageal reflux disease)   . High risk HPV infection   . Hirsutism   . Hyperlipidemia   . Hypertension   . IFG (impaired fasting glucose)   . Menorrhagia   . PCOS (polycystic ovarian syndrome)   . Tuberculosis     Patient Active Problem List   Diagnosis Date Noted  . Other malaise and fatigue 01/16/2013  . Depression, acute 01/16/2013  . Nausea alone 05/04/2012  . Obesity, unspecified 05/04/2012    Past Surgical History:  Procedure Laterality Date  . LAPAROSCOPIC GASTRIC SLEEVE RESECTION      OB History    No data available       Home Medications    Prior to Admission medications   Medication Sig Start Date End Date Taking? Authorizing Provider  dicyclomine (BENTYL) 20 MG tablet Take 1 tablet (20 mg total) by mouth 2 (two) times daily. 03/14/17   Joy, Shawn C, PA-C    DULoxetine HCl (CYMBALTA PO) Take by mouth.    [provider]  famotidine (PEPCID) 20 MG tablet Take 1 tablet (20 mg total) by mouth 2 (two) times daily for 5 days. 03/14/17 03/19/17  Joy, Ines Bloomer C, PA-C  Lisdexamfetamine Dimesylate (VYVANSE PO) Take by mouth.    [provider]  omeprazole (PRILOSEC) 20 MG capsule Take 1 capsule (20 mg total) by mouth daily. 03/14/17 05/13/17  Joy, Shawn C, PA-C  ondansetron (ZOFRAN ODT) 4 MG disintegrating tablet Take 1 tablet (4 mg total) by mouth every 8 (eight) hours as needed for nausea or vomiting. 03/14/17   Joy, Shawn C, PA-C  ROPINIRole HCl (REQUIP PO) Take by mouth.    [provider]  TIZANIDINE HCL PO Take by mouth.    [provider]    Family History Family History  Problem Relation Age of Onset  . Heart disease Mother        CVD  . Diabetes type II Mother   . Hypertension Mother   . Hyperlipidemia Mother   . Diabetes type II Father   . Hypertension Father   . Hyperlipidemia Father   . Diabetes type II Brother   . Hypertension Brother   . Asthma Unknown        Niece    Social History Social History   Tobacco Use  . Smoking status: Never Smoker  . Smokeless tobacco: Never Used  Substance Use  Topics  . Alcohol use: No  . Drug use: No     Allergies   Ibuprofen and Naproxen   Review of Systems Review of Systems  Constitutional: Negative for chills, diaphoresis and fever.  Respiratory: Negative for shortness of breath.   Gastrointestinal: Positive for abdominal distention, abdominal pain, constipation, nausea and vomiting. Negative for blood in stool.  Genitourinary: Negative for dysuria, hematuria and vaginal discharge.  All other systems reviewed and are negative.    Physical Exam Updated Vital Signs BP 135/88 (BP Location: Left Arm)   Pulse 88   Temp 98.7 F (37.1 C) (Oral)   Resp 18   Ht 5\' 8"  (1.727 m)   Wt 109.2 kg (240 lb 11.9 oz)   LMP 02/20/2017   SpO2 99%   BMI 36.60 kg/m    Physical Exam  Constitutional: She appears well-developed and well-nourished. No distress.  HENT:  Head: Normocephalic and atraumatic.  Eyes: Conjunctivae are normal.  Neck: Neck supple.  Cardiovascular: Normal rate, regular rhythm, normal heart sounds and intact distal pulses.  Pulmonary/Chest: Effort normal and breath sounds normal. No respiratory distress.  Abdominal: Soft. Bowel sounds are decreased. There is tenderness in the epigastric area, periumbilical area and left upper quadrant. There is no rigidity, no rebound and no guarding.    Despite the patient's areas of tenderness, she has absolutely no tenderness in the right upper quadrant.  Genitourinary:  Genitourinary Comments: No external hemorrhoids, fissures, or lesions noted. No gross blood or stool burden. No rectal tenderness. PA student, Lequita HaltMorgan, served as chaperone during the rectal exam.  Musculoskeletal: She exhibits no edema.  Lymphadenopathy:    She has no cervical adenopathy.  Neurological: She is alert.  Skin: Skin is warm and dry. She is not diaphoretic.  Psychiatric: She has a normal mood and affect. Her behavior is normal.  Nursing note and vitals reviewed.    ED Treatments / Results  Labs (all labs ordered are listed, but only abnormal results are displayed) Labs Reviewed  URINALYSIS, ROUTINE W REFLEX MICROSCOPIC - Abnormal; Notable for the following components:      Result Value   Specific Gravity, Urine >1.030 (*)    Ketones, ur 15 (*)    Leukocytes, UA TRACE (*)    All other components within normal limits  CBC WITH DIFFERENTIAL/PLATELET - Abnormal; Notable for the following components:   Hemoglobin 11.5 (*)    HCT 34.6 (*)    All other components within normal limits  COMPREHENSIVE METABOLIC PANEL - Abnormal; Notable for the following components:   Sodium 134 (*)    Glucose, Bld 118 (*)    Calcium 8.6 (*)    Total Bilirubin 0.2 (*)    All other components within normal limits  URINALYSIS,  MICROSCOPIC (REFLEX) - Abnormal; Notable for the following components:   Bacteria, UA MANY (*)    Squamous Epithelial / LPF 6-30 (*)    All other components within normal limits  PREGNANCY, URINE  TROPONIN I  LIPASE, BLOOD    EKG  EKG Interpretation  Date/Time:  Friday March 14 2017 15:55:18 EST Ventricular Rate:  90 PR Interval:    QRS Duration: 101 QT Interval:  367 QTC Calculation: 449 R Axis:   39 Text Interpretation:  Sinus rhythm Borderline T abnormalities, anterior leads Confirmed by Zadie RhineWickline, Donald ([id number]) on 03/15/2017 12:18:50 PM       Radiology Ct Abdomen Pelvis W Contrast  Result Date: 03/14/2017 CLINICAL DATA:  Upper abdominal to mid abdominal pain for  1 week. Nausea, vomiting. EXAM: CT ABDOMEN AND PELVIS WITH CONTRAST TECHNIQUE: Multidetector CT imaging of the abdomen and pelvis was performed using the standard protocol following bolus administration of intravenous contrast. CONTRAST:  ISOVUE-300 IOPAMIDOL (ISOVUE-300) INJECTION 61% COMPARISON:  02/06/2011 FINDINGS: Lower chest: Of heart is borderline in size.  No effusions. Hepatobiliary: Diffuse low-density throughout the liver compatible with fatty infiltration. No focal abnormality. Prior cholecystectomy. Pancreas: No focal abnormality or ductal dilatation. Spleen: No focal abnormality.  Normal size. Adrenals/Urinary Tract: No adrenal abnormality. No focal renal abnormality. No stones or hydronephrosis. Urinary bladder is unremarkable. Stomach/Bowel: Postoperative changes in the stomach, likely from prior gastric sleeve. Stomach, large and small bowel grossly unremarkable. Vascular/Lymphatic: No evidence of aneurysm or adenopathy. Reproductive: Multiple follicles in the ovaries bilaterally. Uterus and adnexa unremarkable. No mass. Other: No free fluid or free air. Musculoskeletal: No acute bony abnormality. IMPRESSION: Fatty infiltration of the liver. Prior gastric sleeve and cholecystectomy. No acute findings in  the abdomen or pelvis. Electronically Signed   By: Charlett Nose M.D.   On: 03/14/2017 17:09    Procedures Procedures (including critical care time)  Medications Ordered in ED Medications  ondansetron (ZOFRAN) injection 4 mg (4 mg Intravenous Given 03/14/17 1642)  morphine 4 MG/ML injection 4 mg (4 mg Intravenous Given 03/14/17 1642)  sodium chloride 0.9 % bolus 1,000 mL (0 mLs Intravenous Stopped 03/14/17 1818)  iopamidol (ISOVUE-300) 61 % injection 100 mL (100 mLs Intravenous Contrast Given 03/14/17 1651)     Initial Impression / Assessment and Plan / ED Course  I have reviewed the triage vital signs and the nursing notes.  Pertinent labs & imaging results that were available during my care of the patient were reviewed by me and considered in my medical decision making (see chart for details).  Clinical Course as of Mar 15 1952  Fri Mar 14, 2017  1650 The patient's lack of urinary symptoms combined with the squamous epithelials present in the urine suggest contamination. Bacteria, UA: (!) MANY [SJ]    Clinical Course User Index [SJ] Joy, Shawn C, PA-C    Patient presents with abdominal pain.  Patient has abdominal discomfort, distention, tenderness, nausea, and vomiting in the setting of multiple abdominal surgeries.  Initial suspicion for possible obstruction. Patient is nontoxic appearing, afebrile, not tachycardic, not tachypneic, not hypotensive, maintains SPO2 of 97-99% on room air, and is in no apparent distress. No evidence of obstruction or other acute abnormality noted on CT.  Patient's pain resolved early in the ED course and did not recur.  Subsequent serial abdominal exams were benign.  PCP and GI follow-up. The patient was given instructions for home care as well as return precautions. Patient voices understanding of these instructions, accepts the plan, and is comfortable with discharge.  Vitals:   03/14/17 1413 03/14/17 1414 03/14/17 1555 03/14/17 1556  BP:  (!) 148/104  135/88 135/88  Pulse:  (!) 106 89 88  Resp:  18 18 18   Temp:  98.7 F (37.1 C)    TempSrc:  Oral    SpO2:  97% 99% 99%  Weight: 109.2 kg (240 lb 11.9 oz)     Height: 5\' 8"  (1.727 m)        Final Clinical Impressions(s) / ED Diagnoses   Final diagnoses:  Generalized abdominal pain    ED Discharge Orders        Ordered    dicyclomine (BENTYL) 20 MG tablet  2 times daily     03/14/17 1751  omeprazole (PRILOSEC) 20 MG capsule  Daily     03/14/17 1751    famotidine (PEPCID) 20 MG tablet  2 times daily     03/14/17 1751    ondansetron (ZOFRAN ODT) 4 MG disintegrating tablet  Every 8 hours PRN     03/14/17 1751       Anselm Pancoast, PA-C 03/15/17 1954    Gwyneth Sprout, MD 03/16/17 2157

## 2017-03-14 NOTE — ED Notes (Signed)
Patient transported to CT 

## 2017-03-14 NOTE — ED Triage Notes (Signed)
A/o abd pain, n/v, constipation-took dulcolax then developed diarrhea-NAD-presents to triage in w/c

## 2017-05-05 ENCOUNTER — Ambulatory Visit: Payer: Managed Care, Other (non HMO) | Admitting: Neurology

## 2017-05-05 ENCOUNTER — Encounter: Payer: Self-pay | Admitting: Neurology

## 2017-05-06 ENCOUNTER — Telehealth: Payer: Self-pay

## 2017-05-06 NOTE — Telephone Encounter (Signed)
Patient is no show for new appt on 05/05/2017.

## 2017-10-21 ENCOUNTER — Encounter (HOSPITAL_BASED_OUTPATIENT_CLINIC_OR_DEPARTMENT_OTHER): Payer: Self-pay

## 2017-10-21 ENCOUNTER — Other Ambulatory Visit: Payer: Self-pay

## 2017-10-21 ENCOUNTER — Emergency Department (HOSPITAL_BASED_OUTPATIENT_CLINIC_OR_DEPARTMENT_OTHER)
Admission: EM | Admit: 2017-10-21 | Discharge: 2017-10-21 | Disposition: A | Discharge status: Home / Self Care | Payer: Managed Care, Other (non HMO) | Attending: Emergency Medicine | Admitting: Emergency Medicine

## 2017-10-21 DIAGNOSIS — Z79899 Other long term (current) drug therapy: Secondary | ICD-10-CM | POA: Diagnosis not present

## 2017-10-21 DIAGNOSIS — I10 Essential (primary) hypertension: Secondary | ICD-10-CM | POA: Insufficient documentation

## 2017-10-21 DIAGNOSIS — R1013 Epigastric pain: Secondary | ICD-10-CM | POA: Diagnosis not present

## 2017-10-21 HISTORY — DX: Fibromyalgia: M79.7

## 2017-10-21 LAB — COMPREHENSIVE METABOLIC PANEL
ALK PHOS: 82 U/L (ref 38–126)
ALT: 39 U/L (ref 0–44)
ANION GAP: 8 (ref 5–15)
AST: 28 U/L (ref 15–41)
Albumin: 4.1 g/dL (ref 3.5–5.0)
BUN: 13 mg/dL (ref 6–20)
CALCIUM: 8.7 mg/dL — AB (ref 8.9–10.3)
CO2: 25 mmol/L (ref 22–32)
Chloride: 104 mmol/L (ref 98–111)
Creatinine, Ser: 0.45 mg/dL (ref 0.44–1.00)
GFR calc Af Amer: >60 mL/min (ref >60)
Glucose, Bld: 92 mg/dL (ref 70–99)
Potassium: 3.8 mmol/L (ref 3.5–5.1)
SODIUM: 137 mmol/L (ref 135–145)
Total Bilirubin: 0.3 mg/dL (ref 0.3–1.2)
Total Protein: 7.3 g/dL (ref 6.5–8.1)

## 2017-10-21 LAB — LIPASE, BLOOD: LIPASE: 41 U/L (ref 11–51)

## 2017-10-21 LAB — URINALYSIS, ROUTINE W REFLEX MICROSCOPIC
Bilirubin Urine: NEGATIVE
Glucose, UA: NEGATIVE mg/dL
Hgb urine dipstick: NEGATIVE
KETONES UR: NEGATIVE mg/dL
LEUKOCYTES UA: NEGATIVE
NITRITE: NEGATIVE
PROTEIN: NEGATIVE mg/dL
Specific Gravity, Urine: 1.025 (ref 1.005–1.030)
pH: 6 (ref 5.0–8.0)

## 2017-10-21 LAB — CBC WITH DIFFERENTIAL/PLATELET
Basophils Absolute: 0 10*3/uL (ref 0.0–0.1)
Basophils Relative: 0 %
EOS ABS: 0.3 10*3/uL (ref 0.0–0.7)
EOS PCT: 4 %
HCT: 37.9 % (ref 36.0–46.0)
HEMOGLOBIN: 12.8 g/dL (ref 12.0–15.0)
LYMPHS ABS: 2.6 10*3/uL (ref 0.7–4.0)
Lymphocytes Relative: 38 %
MCH: 28.9 pg (ref 26.0–34.0)
MCHC: 33.8 g/dL (ref 30.0–36.0)
MCV: 85.6 fL (ref 78.0–100.0)
Monocytes Absolute: 0.5 10*3/uL (ref 0.1–1.0)
Monocytes Relative: 8 %
NEUTROS PCT: 50 %
Neutro Abs: 3.3 10*3/uL (ref 1.7–7.7)
Platelets: 262 10*3/uL (ref 150–400)
RBC: 4.43 MIL/uL (ref 3.87–5.11)
RDW: 13.6 % (ref 11.5–15.5)
WBC: 6.7 10*3/uL (ref 4.0–10.5)

## 2017-10-21 LAB — PREGNANCY, URINE: PREG TEST UR: NEGATIVE

## 2017-10-21 LAB — TROPONIN I

## 2017-10-21 MED ORDER — FLUCONAZOLE 150 MG PO TABS: 150.0000 mg | ORAL_TABLET | Freq: Every day | ORAL | 0 refills | Status: AC

## 2017-10-21 MED ORDER — FAMOTIDINE IN NACL 20-0.9 MG/50ML-% IV SOLN
20.0000 mg | Freq: Once | INTRAVENOUS | Status: AC
  Administered 2017-10-21: 20 mg via INTRAVENOUS
  Filled 2017-10-21: qty 50

## 2017-10-21 MED ORDER — SODIUM CHLORIDE 0.9 % IV BOLUS
1000.0000 mL | Freq: Once | INTRAVENOUS | Status: AC
  Administered 2017-10-21: 1000 mL via INTRAVENOUS

## 2017-10-21 MED ORDER — AMOXICILLIN 500 MG PO CAPS: 1000.0000 mg | ORAL_CAPSULE | Freq: Two times a day (BID) | ORAL | 0 refills | Status: AC

## 2017-10-21 MED ORDER — CLARITHROMYCIN 500 MG PO TABS: 500.0000 mg | ORAL_TABLET | Freq: Two times a day (BID) | ORAL | 0 refills | Status: AC

## 2017-10-21 MED ORDER — ONDANSETRON HCL 4 MG/2ML IJ SOLN
4.0000 mg | Freq: Once | INTRAMUSCULAR | Status: AC
  Administered 2017-10-21: 4 mg via INTRAVENOUS
  Filled 2017-10-21: qty 2

## 2017-10-21 MED ORDER — PANTOPRAZOLE SODIUM 20 MG PO TBEC: 20.0000 mg | DELAYED_RELEASE_TABLET | Freq: Two times a day (BID) | ORAL | 0 refills | Status: DC

## 2017-10-21 MED ORDER — GI COCKTAIL ~~LOC~~
30.0000 mL | Freq: Once | ORAL | Status: AC
  Administered 2017-10-21: 30 mL via ORAL
  Filled 2017-10-21: qty 30

## 2017-10-21 NOTE — Discharge Instructions (Addendum)
Your abdominal pain is similar to peptic ulcer disease. Given your current medications and diet, it could be due to a H.pylori infection which I will treat you for today. You will be taking Protonix, Amoxicillin and Clarithromycin twice a day for 7 days. Take these for the full 7 days. After you finish treatment, please follow-up with your GI provider. You may continue taking Protonix which is a heartburn medication after the antibiotics are done.  It was nice meeting you two today. Thank you for allowing me to take care of you today!

## 2017-10-21 NOTE — ED Provider Notes (Signed)
[facility] EMERGENCY DEPARTMENT Provider Note  CSN: [id number] Arrival date & time: 10/21/17  1818    History   Chief Complaint Chief Complaint  Patient presents with  . Abdominal Pain    HPI Paige Hamilton is a 37 y.o. female with a medical history of GERD, PCOS, HTN and s/p gastrectomy who presented to the ED for epigastric pain x2 days. She describes burning pain that radiates to her back that began acutely while at work. She endorsed pain initially before meal time, but states that it has gradually worsened and now she feels it constantly. Current pain is 7-8/10. Associated symptoms of decreased appetite and nausea. Patient has not tried any OTC or Rx meds for relief. Denies fever, chills, chest pain, SOB, change in bowel habits, vomiting, urinary or vaginal complaints.   Past Medical History:  Diagnosis Date  . Chronic pain   . Edema leg   . Fibromyalgia   . GERD (gastroesophageal reflux disease)   . High risk HPV infection   . Hirsutism   . Hyperlipidemia   . Hypertension   . IFG (impaired fasting glucose)   . Menorrhagia   . PCOS (polycystic ovarian syndrome)   . Tuberculosis     Patient Active Problem List   Diagnosis Date Noted  . Other malaise and fatigue 01/16/2013  . Depression, acute 01/16/2013  . Nausea alone 05/04/2012  . Obesity, unspecified 05/04/2012    Past Surgical History:  Procedure Laterality Date  . ABDOMINAL SURGERY    . CHOLECYSTECTOMY    . GASTRECTOMY    . LAPAROSCOPIC GASTRIC SLEEVE RESECTION       OB History   None      Home Medications    Prior to Admission medications   Medication Sig Start Date End Date Taking? Authorizing Provider  amoxicillin (AMOXIL) 500 MG capsule Take 2 capsules (1,000 mg total) by mouth 2 (two) times daily for 7 days. 10/21/17 10/28/17  Zafirah Vanzee, Jerrel Ivory I, PA-C  clarithromycin (BIAXIN) 500 MG tablet Take 1 tablet (500 mg total) by mouth 2 (two) times daily for 7 days. 10/21/17 10/28/17  Keshaun Dubey,  Jerrel Ivory I, PA-C  dicyclomine (BENTYL) 20 MG tablet Take 1 tablet (20 mg total) by mouth 2 (two) times daily. 03/14/17   Joy, Shawn C, PA-C  DULoxetine HCl (CYMBALTA PO) Take by mouth.    [provider]  famotidine (PEPCID) 20 MG tablet Take 1 tablet (20 mg total) by mouth 2 (two) times daily for 5 days. 03/14/17 03/19/17  Joy, Shawn C, PA-C  fluconazole (DIFLUCAN) 150 MG tablet Take 1 tablet (150 mg total) by mouth daily for 7 days. 10/21/17 10/28/17  Darion Juhasz, Jerrel Ivory I, PA-C  Lisdexamfetamine Dimesylate (VYVANSE PO) Take by mouth.    [provider]  omeprazole (PRILOSEC) 20 MG capsule Take 1 capsule (20 mg total) by mouth daily. 03/14/17 05/13/17  Joy, Shawn C, PA-C  ondansetron (ZOFRAN ODT) 4 MG disintegrating tablet Take 1 tablet (4 mg total) by mouth every 8 (eight) hours as needed for nausea or vomiting. 03/14/17   Joy, Shawn C, PA-C  pantoprazole (PROTONIX) 20 MG tablet Take 1 tablet (20 mg total) by mouth 2 (two) times daily. 10/21/17 11/20/17  Maiana Hennigan, Jerrel Ivory I, PA-C  ROPINIRole HCl (REQUIP PO) Take by mouth.    [provider]  TIZANIDINE HCL PO Take by mouth.    [provider]    Family History Family History  Problem Relation Age of Onset  . Heart disease Mother  CVD  . Diabetes type II Mother   . Hypertension Mother   . Hyperlipidemia Mother   . Diabetes type II Father   . Hypertension Father   . Hyperlipidemia Father   . Diabetes type II Brother   . Hypertension Brother   . Asthma Unknown        Niece    Social History Social History   Tobacco Use  . Smoking status: Never Smoker  . Smokeless tobacco: Never Used  Substance Use Topics  . Alcohol use: No  . Drug use: No     Allergies   Ibuprofen and Naproxen   Review of Systems Review of Systems  Constitutional: Positive for appetite change. Negative for chills and fever.  Respiratory: Negative.   Cardiovascular: Negative.   Gastrointestinal: Positive for abdominal pain  and nausea. Negative for blood in stool, constipation, diarrhea and vomiting.  Genitourinary: Negative for dysuria, hematuria, urgency, vaginal bleeding, vaginal discharge and vaginal pain.  Skin: Negative.   Neurological: Negative for dizziness, weakness and light-headedness.     Physical Exam Updated Vital Signs BP 121/69 (BP Location: Left Arm)   Pulse 74   Temp 99 F (37.2 C) (Oral)   Resp 17   LMP 09/26/2017   SpO2 99%   Physical Exam  Constitutional: Vital signs are normal. She appears well-developed and well-nourished. She is cooperative.  Lying in discomfort.  Cardiovascular: Normal rate, regular rhythm, normal heart sounds, intact distal pulses and normal pulses.  No murmur heard. Pulmonary/Chest: Effort normal and breath sounds normal. She exhibits no tenderness.  Abdominal: Soft. Normal appearance and bowel sounds are normal. There is tenderness in the epigastric area.    Neurological: She is alert.  Skin: Skin is warm and intact. Capillary refill takes less than 2 seconds. She is not diaphoretic. No pallor.  Nursing note and vitals reviewed.  ED Treatments / Results  Labs (all labs ordered are listed, but only abnormal results are displayed) Labs Reviewed  COMPREHENSIVE METABOLIC PANEL - Abnormal; Notable for the following components:      Result Value   Calcium 8.7 (*)    All other components within normal limits  URINALYSIS, ROUTINE W REFLEX MICROSCOPIC  PREGNANCY, URINE  CBC WITH DIFFERENTIAL/PLATELET  LIPASE, BLOOD  TROPONIN I    EKG EKG Interpretation  Date/Time:  Tuesday October 21 2017 19:24:22 EDT Ventricular Rate:  76 PR Interval:    QRS Duration: 97 QT Interval:  382 QTC Calculation: 430 R Axis:   47 Text Interpretation:  Sinus rhythm No significant change since last tracing Confirmed by Gwyneth Sprout ([id number]) on 10/21/2017 8:08:15 PM Also confirmed by Gwyneth Sprout ([id number]), editor Barbette Hair [phone])  on 10/22/2017 7:06:46  AM   Radiology No results found.  Procedures Procedures (including critical care time)  Medications Ordered in ED Medications  gi cocktail (Maalox,Lidocaine,Donnatal) (30 mLs Oral Given 10/21/17 2024)  sodium chloride 0.9 % bolus 1,000 mL (0 mLs Intravenous Stopped 10/21/17 2126)  ondansetron (ZOFRAN) injection 4 mg (4 mg Intravenous Given 10/21/17 2024)  famotidine (PEPCID) IVPB 20 mg premix (0 mg Intravenous Stopped 10/21/17 2205)   Initial Impression / Assessment and Plan / ED Course  Triage vital signs and the nursing notes have been reviewed.  Pertinent labs & imaging results that were available during care of the patient were reviewed and considered in medical decision making (see chart for details).   Patient presents epigastric pain that radiates to the back. She has GI history significant for gastrectomy 5 years,  but reports no significant GI issues since then. History and clinical presentation is consistent with PUD. Labs unremarkable without acute abdominal findings on physical exam is useful in ruling out an acute/emergent intra-abdominal process like appendicitis, pancreatitis, perotonitis or perforation. Patient endorses daily Celebrex use for several years without any GI consequences. PUD possibly due to H. Pylori and will treat as such and follow-up with GI. No signs of acute GI bleed. Will give acute symptomatic treatment for nausea and abdominal pain today prior to discharge.   Final Clinical Impressions(s) / ED Diagnoses  1. Epigastric Pain. Likely due to H. Pylori. Will treat with Amoxicillin + Clarithromycin + Protonix x7 days. Advised to follow-up with GI.   Dispo: Home. After thorough clinical evaluation, this patient is determined to be medically stable and can be safely discharged with the previously mentioned treatment and/or outpatient follow-up/referral(s). At this time, there are no other apparent medical conditions that require further screening, evaluation or  treatment.   Final diagnoses:  Epigastric pain    ED Discharge Orders         Ordered    pantoprazole (PROTONIX) 20 MG tablet  2 times daily     10/21/17 2233    amoxicillin (AMOXIL) 500 MG capsule  2 times daily     10/21/17 2233    clarithromycin (BIAXIN) 500 MG tablet  2 times daily     10/21/17 2233    fluconazole (DIFLUCAN) 150 MG tablet  Daily     10/21/17 2240            Reva Bores 10/22/17 1621    Gwyneth Sprout, MD 10/27/17 2342

## 2017-10-21 NOTE — ED Triage Notes (Addendum)
Pt reports 8/10 epigastric pain, n/v, fatigue, diarrhea and bilateral lower leg swelling. Pt reports hx of gastrectomy. Pt A+OX4.

## 2018-01-25 ENCOUNTER — Emergency Department (HOSPITAL_BASED_OUTPATIENT_CLINIC_OR_DEPARTMENT_OTHER): Payer: Self-pay

## 2018-01-25 ENCOUNTER — Other Ambulatory Visit: Payer: Self-pay

## 2018-01-25 ENCOUNTER — Encounter (HOSPITAL_BASED_OUTPATIENT_CLINIC_OR_DEPARTMENT_OTHER): Payer: Self-pay | Admitting: Adult Health

## 2018-01-25 ENCOUNTER — Emergency Department (HOSPITAL_BASED_OUTPATIENT_CLINIC_OR_DEPARTMENT_OTHER)
Admission: EM | Admit: 2018-01-25 | Discharge: 2018-01-25 | Disposition: A | Discharge status: Home / Self Care | Payer: Self-pay | Attending: Emergency Medicine | Admitting: Emergency Medicine

## 2018-01-25 DIAGNOSIS — B349 Viral infection, unspecified: Secondary | ICD-10-CM | POA: Insufficient documentation

## 2018-01-25 DIAGNOSIS — Z79899 Other long term (current) drug therapy: Secondary | ICD-10-CM | POA: Insufficient documentation

## 2018-01-25 DIAGNOSIS — I10 Essential (primary) hypertension: Secondary | ICD-10-CM | POA: Insufficient documentation

## 2018-01-25 DIAGNOSIS — R079 Chest pain, unspecified: Secondary | ICD-10-CM | POA: Insufficient documentation

## 2018-01-25 LAB — URINALYSIS, ROUTINE W REFLEX MICROSCOPIC
Bilirubin Urine: NEGATIVE
GLUCOSE, UA: NEGATIVE mg/dL
Hgb urine dipstick: NEGATIVE
Ketones, ur: NEGATIVE mg/dL
Leukocytes, UA: NEGATIVE
Nitrite: NEGATIVE
Protein, ur: NEGATIVE mg/dL
Specific Gravity, Urine: 1.015 (ref 1.005–1.030)
pH: 5 (ref 5.0–8.0)

## 2018-01-25 LAB — CBC WITH DIFFERENTIAL/PLATELET
Abs Immature Granulocytes: 0.04 10*3/uL (ref 0.00–0.07)
Basophils Absolute: 0 10*3/uL (ref 0.0–0.1)
Basophils Relative: 1 %
Eosinophils Absolute: 0.4 10*3/uL (ref 0.0–0.5)
Eosinophils Relative: 5 %
HEMATOCRIT: 39.8 % (ref 36.0–46.0)
Hemoglobin: 12.7 g/dL (ref 12.0–15.0)
IMMATURE GRANULOCYTES: 1 %
LYMPHS ABS: 2.5 10*3/uL (ref 0.7–4.0)
Lymphocytes Relative: 31 %
MCH: 28.3 pg (ref 26.0–34.0)
MCHC: 31.9 g/dL (ref 30.0–36.0)
MCV: 88.8 fL (ref 80.0–100.0)
MONO ABS: 0.6 10*3/uL (ref 0.1–1.0)
Monocytes Relative: 8 %
NEUTROS ABS: 4.5 10*3/uL (ref 1.7–7.7)
NEUTROS PCT: 54 %
Platelets: 269 10*3/uL (ref 150–400)
RBC: 4.48 MIL/uL (ref 3.87–5.11)
RDW: 13.8 % (ref 11.5–15.5)
WBC: 8 10*3/uL (ref 4.0–10.5)
nRBC: 0 % (ref 0.0–0.2)

## 2018-01-25 LAB — COMPREHENSIVE METABOLIC PANEL
ALK PHOS: 72 U/L (ref 38–126)
ALT: 46 U/L — AB (ref 0–44)
AST: 24 U/L (ref 15–41)
Albumin: 3.9 g/dL (ref 3.5–5.0)
Anion gap: 7 (ref 5–15)
BUN: 12 mg/dL (ref 6–20)
CALCIUM: 8.4 mg/dL — AB (ref 8.9–10.3)
CHLORIDE: 105 mmol/L (ref 98–111)
CO2: 24 mmol/L (ref 22–32)
Creatinine, Ser: 0.6 mg/dL (ref 0.44–1.00)
Glucose, Bld: 157 mg/dL — ABNORMAL HIGH (ref 70–99)
Potassium: 3.6 mmol/L (ref 3.5–5.1)
Sodium: 136 mmol/L (ref 135–145)
Total Bilirubin: 0.3 mg/dL (ref 0.3–1.2)
Total Protein: 7.2 g/dL (ref 6.5–8.1)

## 2018-01-25 LAB — TROPONIN I: Troponin I: <0.03 ng/mL (ref <0.03)

## 2018-01-25 LAB — D-DIMER, QUANTITATIVE: D-Dimer, Quant: 0.33 ug/mL-FEU (ref 0.00–0.50)

## 2018-01-25 LAB — PREGNANCY, URINE: PREG TEST UR: NEGATIVE

## 2018-01-25 MED ORDER — ONDANSETRON HCL 4 MG/2ML IJ SOLN
4.0000 mg | Freq: Once | INTRAMUSCULAR | Status: AC
  Administered 2018-01-25: 4 mg via INTRAVENOUS
  Filled 2018-01-25: qty 2

## 2018-01-25 MED ORDER — SODIUM CHLORIDE 0.9 % IV BOLUS
1000.0000 mL | Freq: Once | INTRAVENOUS | Status: AC
  Administered 2018-01-25: 1000 mL via INTRAVENOUS

## 2018-01-25 MED ORDER — CYCLOBENZAPRINE HCL 10 MG PO TABS
10.0000 mg | ORAL_TABLET | Freq: Once | ORAL | Status: AC
  Administered 2018-01-25: 10 mg via ORAL
  Filled 2018-01-25: qty 1

## 2018-01-25 NOTE — ED Notes (Signed)
Patient left at this time with all belongings. 

## 2018-01-25 NOTE — ED Triage Notes (Signed)
PResents with generalized body aches, and generalized abdominal pain. She reprots that she received her flu shot but feels like she has the flu. This is asocaited with pain with deep inspiration. SHe denies cough but endorses bilateral lower leg swelling. HX of Fibromyalgia, denies HX of HTN, BP here is 171/100 and she endorses terrible headache and feeling lightheaded.

## 2018-01-25 NOTE — ED Provider Notes (Signed)
[facility] EMERGENCY DEPARTMENT Provider Note   CSN: [id number] Arrival date & time: 01/25/18  1909     History   Chief Complaint Chief Complaint  Patient presents with  . Generalized Body Aches    HPI Paige Hamilton is a 37 y.o. female.  HPI  Presenting with body aches all over but specifically concerned regarding chest pain. Feels like heavy sharp pain on the left side, nothing makes it better or worse.  No vomiting but is feeling nausea.  Shortness of breath was severe last night but today tried to calm self down and was somewhat better. Feels like palpitations.    Leg pain bilateral.  Bilat swelling--has had previously, takes lasix and wears compression stockings.  Coughing mild. Nonproductive. No known fevers. Has chills. Mom has CHF/dialysi sand is in hospital. Saw her Code the other day, pulse dropped.   No recent surgeries, no long trips car or airplane, not on OCPs, no hx of DVT/PE  No known sick contacts.  No fam hx of early CAD, no smoking, etoh or drugs  Past Medical History:  Diagnosis Date  . Chronic pain   . Edema leg   . Fibromyalgia   . GERD (gastroesophageal reflux disease)   . High risk HPV infection   . Hirsutism   . Hyperlipidemia   . Hypertension   . IFG (impaired fasting glucose)   . Menorrhagia   . PCOS (polycystic ovarian syndrome)   . Tuberculosis     Patient Active Problem List   Diagnosis Date Noted  . Other malaise and fatigue 01/16/2013  . Depression, acute 01/16/2013  . Nausea alone 05/04/2012  . Obesity, unspecified 05/04/2012    Past Surgical History:  Procedure Laterality Date  . ABDOMINAL SURGERY    . CHOLECYSTECTOMY    . GASTRECTOMY    . LAPAROSCOPIC GASTRIC SLEEVE RESECTION       OB History   No obstetric history on file.      Home Medications    Prior to Admission medications   Medication Sig Start Date End Date Taking? Authorizing Provider  dicyclomine (BENTYL) 20 MG tablet Take 1 tablet (20 mg  total) by mouth 2 (two) times daily. 03/14/17   Joy, Shawn C, PA-C  DULoxetine HCl (CYMBALTA PO) Take by mouth.    [provider]  famotidine (PEPCID) 20 MG tablet Take 1 tablet (20 mg total) by mouth 2 (two) times daily for 5 days. 03/14/17 03/19/17  Joy, Ines BloomerShawn C, PA-C  Lisdexamfetamine Dimesylate (VYVANSE PO) Take by mouth.    [provider]  omeprazole (PRILOSEC) 20 MG capsule Take 1 capsule (20 mg total) by mouth daily. 03/14/17 05/13/17  Joy, Shawn C, PA-C  ondansetron (ZOFRAN ODT) 4 MG disintegrating tablet Take 1 tablet (4 mg total) by mouth every 8 (eight) hours as needed for nausea or vomiting. 03/14/17   Joy, Shawn C, PA-C  pantoprazole (PROTONIX) 20 MG tablet Take 1 tablet (20 mg total) by mouth 2 (two) times daily. 10/21/17 11/20/17  Mortis, Jerrel IvoryGabrielle I, PA-C  ROPINIRole HCl (REQUIP PO) Take by mouth.    [provider]  TIZANIDINE HCL PO Take by mouth.    [provider]    Family History Family History  Problem Relation Age of Onset  . Heart disease Mother        CVD  . Diabetes type II Mother   . Hypertension Mother   . Hyperlipidemia Mother   . Diabetes type II Father   .  Hypertension Father   . Hyperlipidemia Father   . Diabetes type II Brother   . Hypertension Brother   . Asthma Unknown        Niece    Social History Social History   Tobacco Use  . Smoking status: Never Smoker  . Smokeless tobacco: Never Used  Substance Use Topics  . Alcohol use: No  . Drug use: No     Allergies   Ibuprofen and Naproxen   Review of Systems Review of Systems  Constitutional: Positive for fatigue. Negative for fever.  HENT: Positive for congestion. Negative for sore throat.   Eyes: Negative for visual disturbance.  Respiratory: Positive for cough (mild) and shortness of breath.   Cardiovascular: Positive for chest pain and leg swelling.  Gastrointestinal: Positive for abdominal pain and nausea. Negative for diarrhea and vomiting.    Genitourinary: Negative for difficulty urinating and dysuria.  Musculoskeletal: Negative for back pain and neck pain.  Skin: Negative for rash.  Neurological: Negative for syncope and headaches.     Physical Exam Updated Vital Signs BP 137/76 (BP Location: Right Arm)   Pulse 88   Temp 98.1 F (36.7 C) (Oral)   Resp 18   Ht 5\' 8"  (1.727 m)   Wt 105.2 kg   SpO2 99%   BMI 35.28 kg/m   Physical Exam Vitals signs and nursing note reviewed.  Constitutional:      General: She is not in acute distress.    Appearance: She is well-developed. She is not diaphoretic.  HENT:     Head: Normocephalic and atraumatic.  Eyes:     Conjunctiva/sclera: Conjunctivae normal.  Neck:     Musculoskeletal: Normal range of motion.  Cardiovascular:     Rate and Rhythm: Normal rate and regular rhythm.     Heart sounds: Normal heart sounds. No murmur. No friction rub. No gallop.   Pulmonary:     Effort: Pulmonary effort is normal. No respiratory distress.     Breath sounds: Normal breath sounds. No wheezing or rales.  Abdominal:     General: There is no distension.     Palpations: Abdomen is soft.     Tenderness: There is abdominal tenderness (mild epigastric, negative murphy'). There is no guarding.  Musculoskeletal:        General: No tenderness.  Skin:    General: Skin is warm and dry.     Findings: No erythema or rash.  Neurological:     Mental Status: She is alert and oriented to person, place, and time.      ED Treatments / Results  Labs (all labs ordered are listed, but only abnormal results are displayed) Labs Reviewed  COMPREHENSIVE METABOLIC PANEL - Abnormal; Notable for the following components:      Result Value   Glucose, Bld 157 (*)    Calcium 8.4 (*)    ALT 46 (*)    All other components within normal limits  CBC WITH DIFFERENTIAL/PLATELET  PREGNANCY, URINE  D-DIMER, QUANTITATIVE (NOT AT [facility])  TROPONIN I  URINALYSIS, ROUTINE W REFLEX MICROSCOPIC    EKG EKG  Interpretation  Date/Time:  Sunday January 25 2018 21:30:23 EST Ventricular Rate:  88 PR Interval:    QRS Duration: 96 QT Interval:  364 QTC Calculation: 441 R Axis:   47 Text Interpretation:  Sinus rhythm Borderline T abnormalities, anterior leads No significant change since last tracing Confirmed by Alvira Monday ([id number]) on 01/25/2018 9:36:16 PM   Radiology Dg Chest 2 View  Result Date: 01/25/2018 CLINICAL DATA:  Diffuse body pain for 3 days. EXAM: CHEST - 2 VIEW COMPARISON:  Single-view of the chest 03/02/2015. FINDINGS: Lungs clear. Heart size normal. No pneumothorax or pleural fluid. No bony abnormality. IMPRESSION: Normal chest. Electronically Signed   By: Drusilla Kanner M.D.   On: 01/25/2018 19:48    Procedures Procedures (including critical care time)  Medications Ordered in ED Medications  sodium chloride 0.9 % bolus 1,000 mL (0 mLs Intravenous Stopped 01/25/18 2136)  ondansetron (ZOFRAN) injection 4 mg (4 mg Intravenous Given 01/25/18 2033)  cyclobenzaprine (FLEXERIL) tablet 10 mg (10 mg Oral Given 01/25/18 2033)     Initial Impression / Assessment and Plan / ED Course  I have reviewed the triage vital signs and the nursing notes.  Pertinent labs & imaging results that were available during my care of the patient were reviewed by me and considered in my medical decision making (see chart for details).     37 year old female with history above, including history of hypertension, hyperlipidemia and fibromyalgia presents with concern for body aches, and in particular, chest pain and dyspnea.  EKG evaluate me and shows no significant changes.  Troponin is negative.  Given duration of symptoms, negative troponin, have low suspicion for ACS.  She is low risk Wells, with a negative d-dimer, have low suspicion for pulmonary embolus.  Urinalysis shows no sign of infection.  Electrolytes without significant abnormalities. CBC normal.    Given constellation of body aches,  mild cough and nausea, consider influenza as diagnosis. No fever, no indication for empiric tamiflu. Recommend close PCP follow up . Patient discharged in stable condition with understanding of reasons to return.   Final Clinical Impressions(s) / ED Diagnoses   Final diagnoses:  Viral syndrome  Chest pain, unspecified type    ED Discharge Orders    None       Alvira Monday, MD 01/26/18 [phone]

## 2018-02-01 ENCOUNTER — Encounter (HOSPITAL_BASED_OUTPATIENT_CLINIC_OR_DEPARTMENT_OTHER): Payer: Self-pay | Admitting: Emergency Medicine

## 2018-02-01 ENCOUNTER — Emergency Department (HOSPITAL_BASED_OUTPATIENT_CLINIC_OR_DEPARTMENT_OTHER): Payer: Self-pay

## 2018-02-01 ENCOUNTER — Emergency Department (HOSPITAL_BASED_OUTPATIENT_CLINIC_OR_DEPARTMENT_OTHER)
Admission: EM | Admit: 2018-02-01 | Discharge: 2018-02-01 | Disposition: A | Discharge status: Home / Self Care | Payer: Self-pay | Attending: Emergency Medicine | Admitting: Emergency Medicine

## 2018-02-01 ENCOUNTER — Other Ambulatory Visit: Payer: Self-pay

## 2018-02-01 DIAGNOSIS — J181 Lobar pneumonia, unspecified organism: Secondary | ICD-10-CM

## 2018-02-01 DIAGNOSIS — Z79899 Other long term (current) drug therapy: Secondary | ICD-10-CM | POA: Insufficient documentation

## 2018-02-01 DIAGNOSIS — J189 Pneumonia, unspecified organism: Secondary | ICD-10-CM | POA: Insufficient documentation

## 2018-02-01 DIAGNOSIS — I10 Essential (primary) hypertension: Secondary | ICD-10-CM | POA: Insufficient documentation

## 2018-02-01 LAB — BASIC METABOLIC PANEL
ANION GAP: 7 (ref 5–15)
BUN: 16 mg/dL (ref 6–20)
CO2: 22 mmol/L (ref 22–32)
Calcium: 8.8 mg/dL — ABNORMAL LOW (ref 8.9–10.3)
Chloride: 106 mmol/L (ref 98–111)
Creatinine, Ser: 0.51 mg/dL (ref 0.44–1.00)
GFR calc non Af Amer: >60 mL/min (ref >60)
Glucose, Bld: 105 mg/dL — ABNORMAL HIGH (ref 70–99)
Potassium: 3.6 mmol/L (ref 3.5–5.1)
Sodium: 135 mmol/L (ref 135–145)

## 2018-02-01 LAB — TROPONIN I: Troponin I: <0.03 ng/mL (ref <0.03)

## 2018-02-01 LAB — CBC
HCT: 41.4 % (ref 36.0–46.0)
Hemoglobin: 13 g/dL (ref 12.0–15.0)
MCH: 27.8 pg (ref 26.0–34.0)
MCHC: 31.4 g/dL (ref 30.0–36.0)
MCV: 88.7 fL (ref 80.0–100.0)
PLATELETS: 301 10*3/uL (ref 150–400)
RBC: 4.67 MIL/uL (ref 3.87–5.11)
RDW: 13.4 % (ref 11.5–15.5)
WBC: 4.4 10*3/uL (ref 4.0–10.5)
nRBC: 0 % (ref 0.0–0.2)

## 2018-02-01 LAB — PREGNANCY, URINE: Preg Test, Ur: NEGATIVE

## 2018-02-01 MED ORDER — LIDOCAINE VISCOUS HCL 2 % MT SOLN
15.0000 mL | Freq: Once | OROMUCOSAL | Status: AC
  Administered 2018-02-01: 15 mL via ORAL
  Filled 2018-02-01: qty 15

## 2018-02-01 MED ORDER — SILVER SULFADIAZINE 1 % EX CREA
TOPICAL_CREAM | Freq: Two times a day (BID) | CUTANEOUS | Status: DC
  Administered 2018-02-01: 18:00:00 via TOPICAL

## 2018-02-01 MED ORDER — DOXYCYCLINE HYCLATE 100 MG PO CAPS: 100.0000 mg | ORAL_CAPSULE | Freq: Two times a day (BID) | ORAL | 0 refills | Status: DC

## 2018-02-01 MED ORDER — ONDANSETRON 4 MG PO TBDP
4.0000 mg | ORAL_TABLET | Freq: Once | ORAL | Status: AC | PRN
  Administered 2018-02-01: 4 mg via ORAL
  Filled 2018-02-01: qty 1

## 2018-02-01 MED ORDER — ACETAMINOPHEN-CODEINE 120-12 MG/5ML PO SOLN: 10.0000 mL | ORAL | 0 refills | Status: DC | PRN

## 2018-02-01 MED ORDER — ALUM & MAG HYDROXIDE-SIMETH 200-200-20 MG/5ML PO SUSP
30.0000 mL | Freq: Once | ORAL | Status: AC
  Administered 2018-02-01: 30 mL via ORAL
  Filled 2018-02-01: qty 30

## 2018-02-01 MED ORDER — SILVER SULFADIAZINE 1 % EX CREA
TOPICAL_CREAM | CUTANEOUS | Status: AC
  Filled 2018-02-01: qty 85

## 2018-02-01 MED ORDER — SODIUM CHLORIDE 0.9 % IV BOLUS
1000.0000 mL | Freq: Once | INTRAVENOUS | Status: AC
  Administered 2018-02-01: 1000 mL via INTRAVENOUS

## 2018-02-01 MED ORDER — GUAIFENESIN ER 1200 MG PO TB12: 1.0000 | ORAL_TABLET | Freq: Two times a day (BID) | ORAL | 0 refills | Status: DC

## 2018-02-01 NOTE — ED Notes (Signed)
ED Provider at bedside. 

## 2018-02-01 NOTE — ED Notes (Addendum)
Pt c/o chest and abdominal pain, and also has a rash she states is r/t receiving laser treatment in right and left axillary area. Pt on 5 lead on monitor. Pt states she has been coughing and "not feeling well for several days. She was treated in this ED recently for same chest pain symptoms but states she is not getting better. Pt also c/o abdominal pain. Pt denies vomiting or diarrhea.

## 2018-02-01 NOTE — ED Triage Notes (Signed)
Reports chest pain that began last night.  Reports this is worsening.  Reports shortness of breath.

## 2018-02-01 NOTE — Discharge Instructions (Addendum)
Return here as needed.  Follow-up with your primary doctor.  Increase your fluid intake.  Tylenol for fever and pain.

## 2018-02-03 ENCOUNTER — Other Ambulatory Visit: Payer: Self-pay

## 2018-02-03 ENCOUNTER — Encounter (HOSPITAL_BASED_OUTPATIENT_CLINIC_OR_DEPARTMENT_OTHER): Payer: Self-pay

## 2018-02-03 ENCOUNTER — Emergency Department (HOSPITAL_BASED_OUTPATIENT_CLINIC_OR_DEPARTMENT_OTHER)
Admission: EM | Admit: 2018-02-03 | Discharge: 2018-02-03 | Disposition: A | Discharge status: Home / Self Care | Payer: Self-pay | Attending: Emergency Medicine | Admitting: Emergency Medicine

## 2018-02-03 ENCOUNTER — Emergency Department (HOSPITAL_BASED_OUTPATIENT_CLINIC_OR_DEPARTMENT_OTHER): Payer: Self-pay

## 2018-02-03 DIAGNOSIS — R0789 Other chest pain: Secondary | ICD-10-CM | POA: Insufficient documentation

## 2018-02-03 DIAGNOSIS — J189 Pneumonia, unspecified organism: Secondary | ICD-10-CM | POA: Insufficient documentation

## 2018-02-03 DIAGNOSIS — R1013 Epigastric pain: Secondary | ICD-10-CM | POA: Insufficient documentation

## 2018-02-03 DIAGNOSIS — J181 Lobar pneumonia, unspecified organism: Secondary | ICD-10-CM

## 2018-02-03 LAB — TROPONIN I: Troponin I: <0.03 ng/mL (ref <0.03)

## 2018-02-03 LAB — CBC WITH DIFFERENTIAL/PLATELET
Abs Immature Granulocytes: 0.02 10*3/uL (ref 0.00–0.07)
Basophils Absolute: 0 10*3/uL (ref 0.0–0.1)
Basophils Relative: 1 %
Eosinophils Absolute: 0.5 10*3/uL (ref 0.0–0.5)
Eosinophils Relative: 8 %
HCT: 39.8 % (ref 36.0–46.0)
Hemoglobin: 12.6 g/dL (ref 12.0–15.0)
Immature Granulocytes: 0 %
Lymphocytes Relative: 30 %
Lymphs Abs: 1.8 10*3/uL (ref 0.7–4.0)
MCH: 28.1 pg (ref 26.0–34.0)
MCHC: 31.7 g/dL (ref 30.0–36.0)
MCV: 88.8 fL (ref 80.0–100.0)
Monocytes Absolute: 0.4 10*3/uL (ref 0.1–1.0)
Monocytes Relative: 7 %
NEUTROS PCT: 54 %
NRBC: 0 % (ref 0.0–0.2)
Neutro Abs: 3.2 10*3/uL (ref 1.7–7.7)
Platelets: 271 10*3/uL (ref 150–400)
RBC: 4.48 MIL/uL (ref 3.87–5.11)
RDW: 13.2 % (ref 11.5–15.5)
WBC: 5.9 10*3/uL (ref 4.0–10.5)

## 2018-02-03 LAB — COMPREHENSIVE METABOLIC PANEL
ALT: 62 U/L — ABNORMAL HIGH (ref 0–44)
AST: 35 U/L (ref 15–41)
Albumin: 3.9 g/dL (ref 3.5–5.0)
Alkaline Phosphatase: 68 U/L (ref 38–126)
Anion gap: 6 (ref 5–15)
BUN: 14 mg/dL (ref 6–20)
CHLORIDE: 107 mmol/L (ref 98–111)
CO2: 26 mmol/L (ref 22–32)
Calcium: 8.4 mg/dL — ABNORMAL LOW (ref 8.9–10.3)
Creatinine, Ser: 0.57 mg/dL (ref 0.44–1.00)
GFR calc Af Amer: >60 mL/min (ref >60)
GFR calc non Af Amer: >60 mL/min (ref >60)
Glucose, Bld: 90 mg/dL (ref 70–99)
Potassium: 3.5 mmol/L (ref 3.5–5.1)
Sodium: 139 mmol/L (ref 135–145)
Total Bilirubin: 0.7 mg/dL (ref 0.3–1.2)
Total Protein: 7 g/dL (ref 6.5–8.1)

## 2018-02-03 MED ORDER — SODIUM CHLORIDE 0.9 % IV BOLUS
500.0000 mL | Freq: Once | INTRAVENOUS | Status: AC
  Administered 2018-02-03: 500 mL via INTRAVENOUS

## 2018-02-03 NOTE — Discharge Instructions (Addendum)
You have been seen today for cough. Please read and follow all provided instructions.   1. Medications: continue antibiotics and supportive medications as previously prescribed, usual home medications 2. Treatment: rest, drink plenty of fluids 3. Follow Up: Please follow up with your primary doctor in 2 days for discussion of your diagnoses and further evaluation after today's visit; if you do not have a primary care doctor use the resource guide provided to find one; Please return to the ER for any new or worsening symptoms. Please obtain all of your results from medical records or have your doctors office obtain the results - share them with your doctor - you should be seen at your doctors office. Call today to arrange your follow up.   Take medications as prescribed. Please review all of the medicines and only take them if you do not have an allergy to them. Return to the emergency room for worsening condition or new concerning symptoms. Follow up with your regular doctor. If you don't have a regular doctor use one of the numbers below to establish a primary care doctor.  Please be aware that if you are taking birth control pills, taking other prescriptions, ESPECIALLY ANTIBIOTICS may make the birth control ineffective - if this is the case, either do not engage in sexual activity or use alternative methods of birth control such as condoms until you have finished the medicine and your family doctor says it is OK to restart them. If you are on a blood thinner such as COUMADIN, be aware that any other medicine that you take may cause the coumadin to either work too much, or not enough - you should have your coumadin level rechecked in next 7 days if this is the case.  ?  It is also a possibility that you have an allergic reaction to any of the medicines that you have been prescribed - Everybody reacts differently to medications and while MOST people have no trouble with most medicines, you may have a  reaction such as nausea, vomiting, rash, swelling, shortness of breath. If this is the case, please stop taking the medicine immediately and contact your physician.  ?  You should return to the ER if you develop severe or worsening symptoms.   Emergency Department Resource Guide 1) Find a Doctor and Pay Out of Pocket Although you won't have to find out who is covered by your insurance plan, it is a good idea to ask around and get recommendations. You will then need to call the office and see if the doctor you have chosen will accept you as a new patient and what types of options they offer for patients who are self-pay. Some doctors offer discounts or will set up payment plans for their patients who do not have insurance, but you will need to ask so you aren't surprised when you get to your appointment.  2) Contact Your Local Health Department Not all health departments have doctors that can see patients for sick visits, but many do, so it is worth a call to see if yours does. If you don't know where your local health department is, you can check in your phone book. The CDC also has a tool to help you locate your state's health department, and many state websites also have listings of all of their local health departments.  3) Find a Walk-in Clinic If your illness is not likely to be very severe or complicated, you may want to try a walk in  clinic. These are popping up all over the country in pharmacies, drugstores, and shopping centers. They're usually staffed by nurse practitioners or physician assistants that have been trained to treat common illnesses and complaints. They're usually fairly quick and inexpensive. However, if you have serious medical issues or chronic medical problems, these are probably not your best option.  No Primary Care Doctor: Call Health Connect at  [phone] - they can help you locate a primary care doctor that  accepts your insurance, provides certain services,  etc. Physician Referral [phone]  Emergency Department Resource Guide 1) Find a Doctor and Pay Out of Pocket Although you won't have to find out who is covered by your insurance plan, it is a good idea to ask around and get recommendations. You will then need to call the office and see if the doctor you have chosen will accept you as a new patient and what types of options they offer for patients who are self-pay. Some doctors offer discounts or will set up payment plans for their patients who do not have insurance, but you will need to ask so you aren't surprised when you get to your appointment.  2) Contact Your Local Health Department Not all health departments have doctors that can see patients for sick visits, but many do, so it is worth a call to see if yours does. If you don't know where your local health department is, you can check in your phone book. The CDC also has a tool to help you locate your state's health department, and many state websites also have listings of all of their local health departments.  3) Find a Walk-in Clinic If your illness is not likely to be very severe or complicated, you may want to try a walk in clinic. These are popping up all over the country in pharmacies, drugstores, and shopping centers. They're usually staffed by nurse practitioners or physician assistants that have been trained to treat common illnesses and complaints. They're usually fairly quick and inexpensive. However, if you have serious medical issues or chronic medical problems, these are probably not your best option.  No Primary Care Doctor: Call Health Connect at  [phone] - they can help you locate a primary care doctor that  accepts your insurance, provides certain services, etc. Physician Referral Service- [phone]  Chronic Pain Problems: Organization         Address  Phone   Notes  [facility]  [phone] Patients need to be referred by their  primary care doctor.   Medication Assistance: Organization         Address  Phone   Notes  [facility] Medication [facility] [address] [place] [place], [place] [address] [phone] --Must be a resident of [facility] -- Must have NO insurance coverage whatsoever (no Medicaid/ Medicare, etc.) -- The pt. MUST have a primary care doctor that directs their care regularly and follows them in the community   [facility]  [phone]   [employer]  [phone]    Agencies that provide inexpensive medical care: Organization         Address  Phone   Notes  [facility]  [phone]   [facility]    [phone]   [facility] [address] [place], [place] [address] [phone]   [facility] [address] [place] [place] [phone]   [facility]    (  [phone]   [facility]    [phone]   [facility]  [address], [place] Phone:  [phone], Fax:  [phone] Hours of Operation:  9 am - 6 pm, M-F.  Also accepts Medicaid/Medicare and self-pay.  [facility] for [address], [address], [place] Phone: [phone], Fax: [phone]. Hours of Operation:  8:30 am - 5:30 pm, M-F.  Also accepts Medicaid and self-pay.  [facility] [place] [address] [place] Phone: [phone]   [place], [place], [place] [phone], Ext. [phone] Mondays & Thursdays: 7-9 AM.  First 15 patients are seen on a first come, first serve basis.    [place] Providers:  Organization         Address  Phone   Notes  [facility] [address], [place] [phone] Also accepts self-pay patients.  [facility] [address], [place]  [phone]   [place], [address], [place] [phone]   [facility] [address] [place] [phone]   Lucianne Lei [address], [place]   [phone] Only accepts [place] [facility] [place] patients after they have their name applied to their card.   Self-Pay (no insurance) in [facility]:  Organization         Address  Phone   Notes  Sickle Cell Patients, [facility] [place] [phone]   [facility] [place] [phone]   [facility] [place]  [place], [place], [place] [phone]   [facility]/Dr. Osei-Bonsu  [address], [place] or [address], [place] [phone] Phone number for both [place] and [place] locations is the same.  [facility] and [facility] [address] [place] [phone]   [facility] [address], [place] or [address] [phone]   [facility] [address] [place] [phone], phone; [phone], fax Sees patients 1st and 3rd Saturday of every month.  Must not qualify for public or private insurance (i.e. Medicaid, Medicare, [place] Health Choice, Veterans' Benefits)  Household income should be no more than 200% of the poverty level The clinic cannot treat you if you are pregnant or think you are pregnant  Sexually transmitted diseases are not treated at the clinic.

## 2018-02-03 NOTE — ED Provider Notes (Signed)
[facility] EMERGENCY DEPARTMENT Provider Note   CSN: [id number] Arrival date & time: 02/03/18  1419     History   Chief Complaint Chief Complaint  Patient presents with  . Cough    dx PNA 12/22    HPI Paige Hamilton is a 37 y.o. female presenting with a constant productive cough onset 1 week ago. Patient reports she was diagnosed with pneumonia on 12/22. Patient states she has associated middle sharp chest pain, body aches, sore throat, lightheadedness, and occasional shortness of breath. Patient reports loss of appetite and nausea, but denies vomiting or diarrhea. Patient states symptoms are worse with coughing and moving. Patient states nothing makes the symptoms better. Patient states she has tried mucinex, tylenol with codeine, and doxycycline without relief. Patient reports intermittent epigastric pain with coughing. Patient denies fever or syncope. Patient reports sick exposures at home.   HPI  Past Medical History:  Diagnosis Date  . Chronic pain   . Edema leg   . Fibromyalgia   . GERD (gastroesophageal reflux disease)   . High risk HPV infection   . Hirsutism   . Hyperlipidemia   . Hypertension   . IFG (impaired fasting glucose)   . Menorrhagia   . PCOS (polycystic ovarian syndrome)   . Tuberculosis     Patient Active Problem List   Diagnosis Date Noted  . Other malaise and fatigue 01/16/2013  . Depression, acute 01/16/2013  . Nausea alone 05/04/2012  . Obesity, unspecified 05/04/2012    Past Surgical History:  Procedure Laterality Date  . ABDOMINAL SURGERY    . CHOLECYSTECTOMY    . GASTRECTOMY    . LAPAROSCOPIC GASTRIC SLEEVE RESECTION       OB History   No obstetric history on file.      Home Medications    Prior to Admission medications   Medication Sig Start Date End Date Taking? Authorizing Provider  acetaminophen-codeine 120-12 MG/5ML solution Take 10 mLs by mouth every 4 (four) hours as needed for moderate pain. 02/01/18    Lawyer, Cristal Deerhristopher, PA-C  dicyclomine (BENTYL) 20 MG tablet Take 1 tablet (20 mg total) by mouth 2 (two) times daily. 03/14/17   Joy, Shawn C, PA-C  doxycycline (VIBRAMYCIN) 100 MG capsule Take 1 capsule (100 mg total) by mouth 2 (two) times daily. 02/01/18   Lawyer, Cristal Deerhristopher, PA-C  DULoxetine HCl (CYMBALTA PO) Take by mouth.    [provider]  famotidine (PEPCID) 20 MG tablet Take 1 tablet (20 mg total) by mouth 2 (two) times daily for 5 days. 03/14/17 03/19/17  Joy, Shawn C, PA-C  Guaifenesin 1200 MG TB12 Take 1 tablet (1,200 mg total) by mouth 2 (two) times daily. 02/01/18   Lawyer, Cristal Deerhristopher, PA-C  Lisdexamfetamine Dimesylate (VYVANSE PO) Take by mouth.    [provider]  omeprazole (PRILOSEC) 20 MG capsule Take 1 capsule (20 mg total) by mouth daily. 03/14/17 05/13/17  Joy, Shawn C, PA-C  ondansetron (ZOFRAN ODT) 4 MG disintegrating tablet Take 1 tablet (4 mg total) by mouth every 8 (eight) hours as needed for nausea or vomiting. 03/14/17   Joy, Shawn C, PA-C  pantoprazole (PROTONIX) 20 MG tablet Take 1 tablet (20 mg total) by mouth 2 (two) times daily. 10/21/17 11/20/17  Mortis, Jerrel IvoryGabrielle I, PA-C  ROPINIRole HCl (REQUIP PO) Take by mouth.    [provider]  TIZANIDINE HCL PO Take by mouth.    [provider]    Family History Family History  Problem Relation Age  of Onset  . Heart disease Mother        CVD  . Diabetes type II Mother   . Hypertension Mother   . Hyperlipidemia Mother   . Diabetes type II Father   . Hypertension Father   . Hyperlipidemia Father   . Diabetes type II Brother   . Hypertension Brother   . Asthma Other        Niece    Social History Social History   Tobacco Use  . Smoking status: Never Smoker  . Smokeless tobacco: Never Used  Substance Use Topics  . Alcohol use: No  . Drug use: No     Allergies   Ibuprofen and Naproxen   Review of Systems Review of Systems  Constitutional: Positive for activity change,  appetite change and fatigue. Negative for chills and fever.  HENT: Positive for congestion, rhinorrhea and sore throat. Negative for ear pain, trouble swallowing and voice change.   Eyes: Negative for pain.  Respiratory: Positive for cough and shortness of breath. Negative for chest tightness and wheezing.   Cardiovascular: Positive for chest pain. Negative for palpitations and leg swelling.  Gastrointestinal: Positive for abdominal pain and nausea. Negative for diarrhea and vomiting.  Endocrine: Negative for cold intolerance and heat intolerance.  Musculoskeletal: Positive for myalgias. Negative for back pain, gait problem, neck pain and neck stiffness.  Skin: Negative for rash.  Allergic/Immunologic: Negative for immunocompromised state.  Neurological: Positive for light-headedness. Negative for dizziness, syncope and weakness.  Psychiatric/Behavioral: Negative for agitation and behavioral problems. The patient is not nervous/anxious.      Physical Exam Updated Vital Signs BP 136/82   Pulse 79   Temp 98.6 F (37 C) (Oral)   Resp (!) 24   Ht 5\' 8"  (1.727 m)   Wt 109.3 kg   LMP 01/29/2018   SpO2 99%   BMI 36.64 kg/m   Physical Exam Vitals signs and nursing note reviewed.  Constitutional:      General: She is not in acute distress.    Appearance: She is well-developed. She is not diaphoretic.  HENT:     Head: Normocephalic and atraumatic.     Right Ear: Tympanic membrane, ear canal and external ear normal.     Left Ear: Tympanic membrane, ear canal and external ear normal.     Nose: Congestion and rhinorrhea present.     Mouth/Throat:     Pharynx: No oropharyngeal exudate or posterior oropharyngeal erythema.  Eyes:     Extraocular Movements: Extraocular movements intact.     Conjunctiva/sclera: Conjunctivae normal.     Pupils: Pupils are equal, round, and reactive to light.  Neck:     Musculoskeletal: Normal range of motion and neck supple.     Vascular: No JVD.    Cardiovascular:     Rate and Rhythm: Normal rate and regular rhythm.     Pulses: Normal pulses.          Radial pulses are 2+ on the right side and 2+ on the left side.       Dorsalis pedis pulses are 2+ on the right side and 2+ on the left side.     Heart sounds: Normal heart sounds. No murmur. No friction rub. No gallop.   Pulmonary:     Effort: Pulmonary effort is normal. No respiratory distress.     Breath sounds: Normal breath sounds. No wheezing or rales.  Chest:     Chest wall: Tenderness (Tenderness upon palpation of middle of  chest. ) present.  Abdominal:     Palpations: Abdomen is soft.     Tenderness: There is abdominal tenderness in the epigastric area.  Musculoskeletal: Normal range of motion.  Skin:    General: Skin is warm.     Capillary Refill: Capillary refill takes less than 2 seconds.     Coloration: Skin is not pale.     Findings: No rash.  Neurological:     Mental Status: She is alert and oriented to person, place, and time.     Gait: Gait normal.      ED Treatments / Results  Labs (all labs ordered are listed, but only abnormal results are displayed) Labs Reviewed  COMPREHENSIVE METABOLIC PANEL - Abnormal; Notable for the following components:      Result Value   Calcium 8.4 (*)    ALT 62 (*)    All other components within normal limits  TROPONIN I  CBC WITH DIFFERENTIAL/PLATELET    EKG EKG: unchanged from previous tracings, normal sinus rhythm, nonspecific repolarization abnormality in inferior leads.  Radiology Dg Chest 2 View  Result Date: 02/03/2018 CLINICAL DATA:  Cough, cp, rib pain x1wk. chest pain, shortness of breath EXAM: CHEST - 2 VIEW COMPARISON:  02/01/2018 FINDINGS: A persistent subtle airspace disease in the RIGHT mid lung probably localizing to the superior segment of the RIGHT lower lobe. No pleural fluid. No consolidation. No pneumothorax. IMPRESSION: Persistent subtle airspace disease in the superior segment of the RIGHT lower  lobe may represent pneumonia. Minimal change from 2 days prior. Electronically Signed   By: Genevive BiStewart  Edmunds M.D.   On: 02/03/2018 16:07    Procedures Procedures (including critical care time)  Medications Ordered in ED Medications  sodium chloride 0.9 % bolus 500 mL (500 mLs Intravenous New Bag/Given 02/03/18 1656)     Initial Impression / Assessment and Plan / ED Course  I have reviewed the triage vital signs and the nursing notes.  Pertinent labs & imaging results that were available during my care of the patient were reviewed by me and considered in my medical decision making (see chart for details).  Clinical Course as of Feb 03 1821  Tue Feb 03, 2018  1611 CXR reveals persistent subtle airspace disease in the superior segment of the right lower lobe may represent pneumonia. Minimal change from CXR 2 days ago.    DG Chest 2 View [AH]  1736 Patient has improved while in the ER.    [AH]  1736 CBC is unremarkable.   CBC with Differential [AH]    Clinical Course User Index [AH] Carlyle BasquesHernandez, Kyera Felan P, PA-C   Pt CXR reveals an area of subtle airspace disease in right lower lobe that is consistent patient's diagnosis of pneumonia. CXR is minimally changed from last CXR. CBC is unremarkable. Patients symptoms are consistent with pneumonia. Provided IVF in the ER. Discussed to continue antibiotics and symptomatic treatment as previously prescribed. Patient is stable for discharge. Verbalizes understanding and is agreeable with plan. Pt is hemodynamically stable & in NAD prior to dc.   Final Clinical Impressions(s) / ED Diagnoses   Final diagnoses:  Community acquired pneumonia of right lower lobe of lung [facility](HCC)    ED Discharge Orders    None       Leretha DykesHernandez, Juliocesar Blasius P, [place]PA-C 02/03/18 Ardyth Harps1822    Plunkett, Whitney, MD 02/04/18 [phone]

## 2018-02-03 NOTE — ED Triage Notes (Signed)
Pt states she was dx with PNA 2 days ago-states she has cont;s cough, CP, SOB, body aches-to triage in w/c-stood for weight w/o difficulty

## 2018-02-09 NOTE — ED Provider Notes (Signed)
[facility] EMERGENCY DEPARTMENT Provider Note   CSN: [id number] Arrival date & time: 02/01/18  1305     History   Chief Complaint Chief Complaint  Patient presents with  . Chest Pain    HPI Paige Hamilton is a 37 y.o. female.  HPI Patient presents to the emergency department with cough and chest discomfort that started yesterday.  Patient is also stating that she feels some shortness of breath.  The patient states that she did not take any medications prior to arrival for symptoms.  Patient denies anything that makes condition better or worse.  The patient deniesheadache,blurred vision, neck pain, fever, weakness, numbness, dizziness, anorexia, edema, abdominal pain, nausea, vomiting, diarrhea, rash, back pain, dysuria, hematemesis, bloody stool, near syncope, or syncope. Past Medical History:  Diagnosis Date  . Chronic pain   . Edema leg   . Fibromyalgia   . GERD (gastroesophageal reflux disease)   . High risk HPV infection   . Hirsutism   . Hyperlipidemia   . Hypertension   . IFG (impaired fasting glucose)   . Menorrhagia   . PCOS (polycystic ovarian syndrome)   . Tuberculosis     Patient Active Problem List   Diagnosis Date Noted  . Other malaise and fatigue 01/16/2013  . Depression, acute 01/16/2013  . Nausea alone 05/04/2012  . Obesity, unspecified 05/04/2012    Past Surgical History:  Procedure Laterality Date  . ABDOMINAL SURGERY    . CHOLECYSTECTOMY    . GASTRECTOMY    . LAPAROSCOPIC GASTRIC SLEEVE RESECTION       OB History   No obstetric history on file.      Home Medications    Prior to Admission medications   Medication Sig Start Date End Date Taking? Authorizing Provider  acetaminophen-codeine 120-12 MG/5ML solution Take 10 mLs by mouth every 4 (four) hours as needed for moderate pain. 02/01/18   Charlisha Market, Cristal Deerhristopher, PA-C  dicyclomine (BENTYL) 20 MG tablet Take 1 tablet (20 mg total) by mouth 2 (two) times daily. 03/14/17   Joy,  Shawn C, PA-C  doxycycline (VIBRAMYCIN) 100 MG capsule Take 1 capsule (100 mg total) by mouth 2 (two) times daily. 02/01/18   Jennamarie Goings, Cristal Deerhristopher, PA-C  DULoxetine HCl (CYMBALTA PO) Take by mouth.    [provider]  famotidine (PEPCID) 20 MG tablet Take 1 tablet (20 mg total) by mouth 2 (two) times daily for 5 days. 03/14/17 03/19/17  Joy, Shawn C, PA-C  Guaifenesin 1200 MG TB12 Take 1 tablet (1,200 mg total) by mouth 2 (two) times daily. 02/01/18   Denece Shearer, Cristal Deerhristopher, PA-C  Lisdexamfetamine Dimesylate (VYVANSE PO) Take by mouth.    [provider]  omeprazole (PRILOSEC) 20 MG capsule Take 1 capsule (20 mg total) by mouth daily. 03/14/17 05/13/17  Joy, Shawn C, PA-C  ondansetron (ZOFRAN ODT) 4 MG disintegrating tablet Take 1 tablet (4 mg total) by mouth every 8 (eight) hours as needed for nausea or vomiting. 03/14/17   Joy, Shawn C, PA-C  pantoprazole (PROTONIX) 20 MG tablet Take 1 tablet (20 mg total) by mouth 2 (two) times daily. 10/21/17 11/20/17  Mortis, Jerrel IvoryGabrielle I, PA-C  ROPINIRole HCl (REQUIP PO) Take by mouth.    [provider]  TIZANIDINE HCL PO Take by mouth.    [provider]    Family History Family History  Problem Relation Age of Onset  . Heart disease Mother        CVD  . Diabetes type II Mother   .  Hypertension Mother   . Hyperlipidemia Mother   . Diabetes type II Father   . Hypertension Father   . Hyperlipidemia Father   . Diabetes type II Brother   . Hypertension Brother   . Asthma Other        Niece    Social History Social History   Tobacco Use  . Smoking status: Never Smoker  . Smokeless tobacco: Never Used  Substance Use Topics  . Alcohol use: No  . Drug use: No     Allergies   Ibuprofen and Naproxen   Review of Systems Review of Systems All other systems negative except as documented in the HPI. All pertinent positives and negatives as reviewed in the HPI.  Physical Exam Updated Vital Signs BP 118/67   Pulse 87    Temp 98.6 F (37 C) (Oral)   Resp (!) 21   Ht 5\' 8"  (1.727 m)   Wt 109.3 kg   LMP 02/01/2018 (Exact Date)   SpO2 100%   BMI 36.64 kg/m   Physical Exam Vitals signs and nursing note reviewed.  Constitutional:      General: She is not in acute distress.    Appearance: She is well-developed.  HENT:     Head: Normocephalic and atraumatic.  Eyes:     Pupils: Pupils are equal, round, and reactive to light.  Neck:     Musculoskeletal: Normal range of motion and neck supple.  Cardiovascular:     Rate and Rhythm: Normal rate and regular rhythm.     Heart sounds: Normal heart sounds. No murmur. No friction rub. No gallop.   Pulmonary:     Effort: Pulmonary effort is normal. No respiratory distress.     Breath sounds: Examination of the right-lower field reveals decreased breath sounds. Examination of the left-lower field reveals decreased breath sounds. Decreased breath sounds present. No wheezing.  Abdominal:     General: Bowel sounds are normal. There is no distension.     Palpations: Abdomen is soft.     Tenderness: There is no abdominal tenderness. There is no rebound.  Skin:    General: Skin is warm and dry.     Capillary Refill: Capillary refill takes less than 2 seconds.     Findings: No erythema or rash.  Neurological:     Mental Status: She is alert and oriented to person, place, and time.     Motor: No abnormal muscle tone.     Coordination: Coordination normal.  Psychiatric:        Behavior: Behavior normal.      ED Treatments / Results  Labs (all labs ordered are listed, but only abnormal results are displayed) Labs Reviewed  BASIC METABOLIC PANEL - Abnormal; Notable for the following components:      Result Value   Glucose, Bld 105 (*)    Calcium 8.8 (*)    All other components within normal limits  CBC  TROPONIN I  PREGNANCY, URINE    EKG None  Radiology No results found.  Procedures Procedures (including critical care time)  Medications  Ordered in ED Medications  ondansetron (ZOFRAN-ODT) disintegrating tablet 4 mg (4 mg Oral Given 02/01/18 1338)  sodium chloride 0.9 % bolus 1,000 mL (0 mLs Intravenous Stopped 02/01/18 1722)  alum & mag hydroxide-simeth (MAALOX/MYLANTA) 200-200-20 MG/5ML suspension 30 mL (30 mLs Oral Given 02/01/18 1640)    And  lidocaine (XYLOCAINE) 2 % viscous mouth solution 15 mL (15 mLs Oral Given 02/01/18 1640)  Initial Impression / Assessment and Plan / ED Course  I have reviewed the triage vital signs and the nursing notes.  Pertinent labs & imaging results that were available during my care of the patient were reviewed by me and considered in my medical decision making (see chart for details).     Patient will be treated for community-acquired pneumonia.  Have advised her to increase her fluid intake and rest as much as possible.  Patient's oxygen saturations and vital signs remained stable here in the emergency department.  I feel that the patient's chest pain is related to the pneumonia.  Final Clinical Impressions(s) / ED Diagnoses   Final diagnoses:  Community acquired pneumonia of left lower lobe of lung [facility])    ED Discharge Orders         Ordered    Guaifenesin 1200 MG TB12  2 times daily     02/01/18 1755    doxycycline (VIBRAMYCIN) 100 MG capsule  2 times daily     02/01/18 1755    acetaminophen-codeine 120-12 MG/5ML solution  Every 4 hours PRN     02/01/18 1755           Charlestine Night, PA-C 02/09/18 0440    Sabas Sous, MD 02/09/18 [phone]

## 2018-05-20 ENCOUNTER — Ambulatory Visit: Payer: Self-pay | Admitting: Obstetrics & Gynecology

## 2018-07-22 ENCOUNTER — Encounter (HOSPITAL_BASED_OUTPATIENT_CLINIC_OR_DEPARTMENT_OTHER): Payer: Self-pay

## 2018-07-22 ENCOUNTER — Emergency Department (HOSPITAL_BASED_OUTPATIENT_CLINIC_OR_DEPARTMENT_OTHER): Payer: BLUE CROSS/BLUE SHIELD

## 2018-07-22 ENCOUNTER — Other Ambulatory Visit: Payer: Self-pay

## 2018-07-22 ENCOUNTER — Emergency Department (HOSPITAL_BASED_OUTPATIENT_CLINIC_OR_DEPARTMENT_OTHER)
Admission: EM | Admit: 2018-07-22 | Discharge: 2018-07-22 | Disposition: A | Discharge status: Home / Self Care | Payer: BLUE CROSS/BLUE SHIELD | Attending: Emergency Medicine | Admitting: Emergency Medicine

## 2018-07-22 DIAGNOSIS — I10 Essential (primary) hypertension: Secondary | ICD-10-CM | POA: Diagnosis not present

## 2018-07-22 DIAGNOSIS — Z79899 Other long term (current) drug therapy: Secondary | ICD-10-CM | POA: Diagnosis not present

## 2018-07-22 DIAGNOSIS — R1031 Right lower quadrant pain: Secondary | ICD-10-CM | POA: Diagnosis present

## 2018-07-22 DIAGNOSIS — R109 Unspecified abdominal pain: Secondary | ICD-10-CM

## 2018-07-22 DIAGNOSIS — Z3202 Encounter for pregnancy test, result negative: Secondary | ICD-10-CM | POA: Insufficient documentation

## 2018-07-22 LAB — COMPREHENSIVE METABOLIC PANEL
ALT: 47 U/L — ABNORMAL HIGH (ref 0–44)
AST: 32 U/L (ref 15–41)
Albumin: 4 g/dL (ref 3.5–5.0)
Alkaline Phosphatase: 66 U/L (ref 38–126)
Anion gap: 6 (ref 5–15)
BUN: 13 mg/dL (ref 6–20)
CO2: 25 mmol/L (ref 22–32)
Calcium: 8.6 mg/dL — ABNORMAL LOW (ref 8.9–10.3)
Chloride: 108 mmol/L (ref 98–111)
Creatinine, Ser: 0.53 mg/dL (ref 0.44–1.00)
GFR calc Af Amer: >60 mL/min (ref >60)
GFR calc non Af Amer: >60 mL/min (ref >60)
Glucose, Bld: 105 mg/dL — ABNORMAL HIGH (ref 70–99)
Potassium: 3.7 mmol/L (ref 3.5–5.1)
Sodium: 139 mmol/L (ref 135–145)
Total Bilirubin: 0.4 mg/dL (ref 0.3–1.2)
Total Protein: 6.8 g/dL (ref 6.5–8.1)

## 2018-07-22 LAB — CBC
HCT: 39.5 % (ref 36.0–46.0)
Hemoglobin: 12.6 g/dL (ref 12.0–15.0)
MCH: 28.6 pg (ref 26.0–34.0)
MCHC: 31.9 g/dL (ref 30.0–36.0)
MCV: 89.8 fL (ref 80.0–100.0)
Platelets: 303 10*3/uL (ref 150–400)
RBC: 4.4 MIL/uL (ref 3.87–5.11)
RDW: 13.5 % (ref 11.5–15.5)
WBC: 6.9 10*3/uL (ref 4.0–10.5)
nRBC: 0 % (ref 0.0–0.2)

## 2018-07-22 LAB — PREGNANCY, URINE: Preg Test, Ur: NEGATIVE

## 2018-07-22 LAB — URINALYSIS, ROUTINE W REFLEX MICROSCOPIC
Bilirubin Urine: NEGATIVE
Glucose, UA: NEGATIVE mg/dL
Ketones, ur: NEGATIVE mg/dL
Leukocytes,Ua: NEGATIVE
Nitrite: NEGATIVE
Protein, ur: NEGATIVE mg/dL
Specific Gravity, Urine: >1.03 — ABNORMAL HIGH (ref 1.005–1.030)
pH: 5.5 (ref 5.0–8.0)

## 2018-07-22 LAB — URINALYSIS, MICROSCOPIC (REFLEX): WBC, UA: NONE SEEN WBC/hpf (ref 0–5)

## 2018-07-22 MED ORDER — DICYCLOMINE HCL 10 MG/ML IM SOLN
20.0000 mg | Freq: Once | INTRAMUSCULAR | Status: AC
  Administered 2018-07-22: 22:00:00 20 mg via INTRAMUSCULAR
  Filled 2018-07-22: qty 2

## 2018-07-22 MED ORDER — CYCLOBENZAPRINE HCL 10 MG PO TABS
10.0000 mg | ORAL_TABLET | Freq: Once | ORAL | Status: AC
  Administered 2018-07-22: 10 mg via ORAL
  Filled 2018-07-22: qty 1

## 2018-07-22 MED ORDER — DICYCLOMINE HCL 20 MG PO TABS: 20.0000 mg | ORAL_TABLET | Freq: Two times a day (BID) | ORAL | 0 refills | Status: DC

## 2018-07-22 MED ORDER — CYCLOBENZAPRINE HCL 10 MG PO TABS: 10.0000 mg | ORAL_TABLET | Freq: Two times a day (BID) | ORAL | 0 refills | Status: DC | PRN

## 2018-07-22 NOTE — ED Notes (Signed)
Patient transported to CT 

## 2018-07-22 NOTE — ED Provider Notes (Signed)
[facility] EMERGENCY DEPARTMENT Provider Note   CSN: [id number] Arrival date & time: 07/22/18  1926    History   Chief Complaint Chief Complaint  Patient presents with  . Edema    HPI Paige Hamilton is a 38 y.o. female.     HPI   Right lower quadrant and flank pain began this AM Stabbing pain radiating to back, constant Can't tell if worse with eating or drinking Right at dinner pain was so severe that couldn't finish it Bilateral edema began yesterday Vaginal bleeding for 3.935mos going to see OB No fevers No nausea or vomiting No diarrhea or constipation   Past Medical History:  Diagnosis Date  . Chronic pain   . Edema leg   . Fibromyalgia   . GERD (gastroesophageal reflux disease)   . High risk HPV infection   . Hirsutism   . Hyperlipidemia   . Hypertension   . IFG (impaired fasting glucose)   . Menorrhagia   . PCOS (polycystic ovarian syndrome)   . Tuberculosis     Patient Active Problem List   Diagnosis Date Noted  . Other malaise and fatigue 01/16/2013  . Depression, acute 01/16/2013  . Nausea alone 05/04/2012  . Obesity, unspecified 05/04/2012    Past Surgical History:  Procedure Laterality Date  . ABDOMINAL SURGERY    . CHOLECYSTECTOMY    . GASTRECTOMY    . LAPAROSCOPIC GASTRIC SLEEVE RESECTION       OB History   No obstetric history on file.      Home Medications    Prior to Admission medications   Medication Sig Start Date End Date Taking? Authorizing Provider  acetaminophen-codeine 120-12 MG/5ML solution Take 10 mLs by mouth every 4 (four) hours as needed for moderate pain. 02/01/18   Lawyer, Cristal Deerhristopher, PA-C  cyclobenzaprine (FLEXERIL) 10 MG tablet Take 1 tablet (10 mg total) by mouth 2 (two) times daily as needed for muscle spasms. 07/22/18   Alvira MondaySchlossman, Galya Dunnigan, MD  dicyclomine (BENTYL) 20 MG tablet Take 1 tablet (20 mg total) by mouth 2 (two) times daily. 07/22/18   Alvira MondaySchlossman, Hashim Eichhorst, MD  doxycycline (VIBRAMYCIN) 100 MG  capsule Take 1 capsule (100 mg total) by mouth 2 (two) times daily. 02/01/18   Lawyer, Cristal Deerhristopher, PA-C  DULoxetine HCl (CYMBALTA PO) Take by mouth.    [provider]  famotidine (PEPCID) 20 MG tablet Take 1 tablet (20 mg total) by mouth 2 (two) times daily for 5 days. 03/14/17 03/19/17  Joy, Shawn C, PA-C  Guaifenesin 1200 MG TB12 Take 1 tablet (1,200 mg total) by mouth 2 (two) times daily. 02/01/18   Lawyer, Cristal Deerhristopher, PA-C  Lisdexamfetamine Dimesylate (VYVANSE PO) Take by mouth.    [provider]  omeprazole (PRILOSEC) 20 MG capsule Take 1 capsule (20 mg total) by mouth daily. 03/14/17 05/13/17  Joy, Shawn C, PA-C  ondansetron (ZOFRAN ODT) 4 MG disintegrating tablet Take 1 tablet (4 mg total) by mouth every 8 (eight) hours as needed for nausea or vomiting. 03/14/17   Joy, Shawn C, PA-C  pantoprazole (PROTONIX) 20 MG tablet Take 1 tablet (20 mg total) by mouth 2 (two) times daily. 10/21/17 11/20/17  Mortis, Jerrel IvoryGabrielle I, PA-C  ROPINIRole HCl (REQUIP PO) Take by mouth.    [provider]  TIZANIDINE HCL PO Take by mouth.    [provider]    Family History Family History  Problem Relation Age of Onset  . Heart disease Mother        CVD  .  Diabetes type II Mother   . Hypertension Mother   . Hyperlipidemia Mother   . Diabetes type II Father   . Hypertension Father   . Hyperlipidemia Father   . Diabetes type II Brother   . Hypertension Brother   . Asthma Other        Niece    Social History Social History   Tobacco Use  . Smoking status: Never Smoker  . Smokeless tobacco: Never Used  Substance Use Topics  . Alcohol use: No  . Drug use: No     Allergies   Ibuprofen and Naproxen   Review of Systems Review of Systems  Constitutional: Negative for fever.  HENT: Negative for sore throat.   Eyes: Negative for visual disturbance.  Respiratory: Negative for cough and shortness of breath.   Cardiovascular: Negative for chest pain.   Gastrointestinal: Positive for abdominal pain. Negative for constipation, diarrhea, nausea and vomiting.  Genitourinary: Positive for flank pain and vaginal bleeding. Negative for difficulty urinating and dysuria.  Musculoskeletal: Negative for back pain and neck pain.  Skin: Negative for rash.  Neurological: Negative for syncope and headaches.     Physical Exam Updated Vital Signs BP 127/83   Pulse 81   Temp 98.4 F (36.9 C) (Oral)   Resp 18   Ht 5\' 9"  (1.753 m)   Wt 112 kg   SpO2 100%   BMI 36.48 kg/m   Physical Exam Vitals signs and nursing note reviewed.  Constitutional:      General: She is not in acute distress.    Appearance: She is well-developed. She is not diaphoretic.  HENT:     Head: Normocephalic and atraumatic.  Eyes:     Conjunctiva/sclera: Conjunctivae normal.  Neck:     Musculoskeletal: Normal range of motion.  Cardiovascular:     Rate and Rhythm: Normal rate and regular rhythm.  Pulmonary:     Effort: Pulmonary effort is normal. No respiratory distress.     Breath sounds: Normal breath sounds.  Abdominal:     General: There is distension.     Palpations: Abdomen is soft.     Tenderness: There is abdominal tenderness (RLQ, right flank, neg murphy's). There is right CVA tenderness. There is no guarding.  Musculoskeletal:        General: No tenderness.  Skin:    General: Skin is warm and dry.     Findings: No erythema or rash.  Neurological:     Mental Status: She is alert and oriented to person, place, and time.      ED Treatments / Results  Labs (all labs ordered are listed, but only abnormal results are displayed) Labs Reviewed  URINALYSIS, ROUTINE W REFLEX MICROSCOPIC - Abnormal; Notable for the following components:      Result Value   Specific Gravity, Urine >1.030 (*)    Hgb urine dipstick LARGE (*)    All other components within normal limits  COMPREHENSIVE METABOLIC PANEL - Abnormal; Notable for the following components:    Glucose, Bld 105 (*)    Calcium 8.6 (*)    ALT 47 (*)    All other components within normal limits  URINALYSIS, MICROSCOPIC (REFLEX) - Abnormal; Notable for the following components:   Bacteria, UA RARE (*)    All other components within normal limits  CBC  PREGNANCY, URINE    EKG None  Radiology Ct Renal Stone Study  Result Date: 07/22/2018 CLINICAL DATA:  Right flank pain. EXAM: CT ABDOMEN AND PELVIS  WITHOUT CONTRAST TECHNIQUE: Multidetector CT imaging of the abdomen and pelvis was performed following the standard protocol without IV contrast. COMPARISON:  CT dated 03/14/2017. FINDINGS: Lower chest: No acute abnormality. Hepatobiliary: There is hepatomegaly with hepatic steatosis. The patient is status post prior cholecystectomy. There is no significant biliary ductal dilatation. Pancreas: Unremarkable. No pancreatic ductal dilatation or surrounding inflammatory changes. Spleen: Normal in size without focal abnormality. Adrenals/Urinary Tract: Adrenal glands are unremarkable. Kidneys are normal, without renal calculi, focal lesion, or hydronephrosis. Bladder is unremarkable. Stomach/Bowel: The patient is status post prior sleeve gastrectomy there is scattered colonic diverticula without CT evidence of diverticulitis there is a moderate amount of stool in the colon. No evidence of a small-bowel obstruction. The appendix is not reliably identified. Vascular/Lymphatic: No significant vascular findings are present. No enlarged abdominal or pelvic lymph nodes. There is a retroaortic left renal vein, a normal variant there are few prominent lymph nodes along the external iliac chain which appear morphologically normal. Reproductive: Uterus and bilateral adnexa are unremarkable. Other: No abdominal wall hernia or abnormality. No abdominopelvic ascites. Musculoskeletal: No acute or significant osseous findings. IMPRESSION: 1. No acute intra-abdominal abnormality detected. 2. Hepatomegaly with hepatic  steatosis. 3. Moderate amount of stool throughout the colon. The patient is status post prior sleeve gastrectomy. The patient is status post prior cholecystectomy. The appendix was not well identified, however there are no significant inflammatory changes in the right lower quadrant. Electronically Signed   By: Katherine Mantlehristopher  Green M.D.   On: 07/22/2018 21:52    Procedures Procedures (including critical care time)  Medications Ordered in ED Medications  dicyclomine (BENTYL) injection 20 mg (20 mg Intramuscular Given 07/22/18 2220)  cyclobenzaprine (FLEXERIL) tablet 10 mg (10 mg Oral Given 07/22/18 2220)     Initial Impression / Assessment and Plan / ED Course  I have reviewed the triage vital signs and the nursing notes.  Pertinent labs & imaging results that were available during my care of the patient were reviewed by me and considered in my medical decision making (see chart for details).        38 year old female with history of hypertension, hyperlipidemia, PCOS, chronic pain, gastric sleeve resection, cholecystectomy, presents with concern for bilateral lower extremity swelling and right flank pain.  She has good bilateral lower pulses and have low suspicion for acute arterial thrombus.  Doubt DVT given symmetric swelling.  Regarding right flank pain: Urinalysis showed no sign of urinary infection or pyelonephritis.  CT stone study was done which did not show evidence of nephrolithiasis, and shows no sign of ovarian mass, and given location of pain primarily I have a very low suspicion that her flank pain represents ovarian torsion.  Possible flank pain originating from spine and muscle spasm> Given bentyl and flexeril with improvement and rx for same.  Patient discharged in stable condition with understanding of reasons to return.   Final Clinical Impressions(s) / ED Diagnoses   Final diagnoses:  Right flank pain  Right sided abdominal pain    ED Discharge Orders         Ordered     cyclobenzaprine (FLEXERIL) 10 MG tablet  2 times daily PRN     07/22/18 2259    dicyclomine (BENTYL) 20 MG tablet  2 times daily     07/22/18 2259           Alvira MondaySchlossman, Mallie Linnemann, MD 07/23/18 1321

## 2018-07-22 NOTE — ED Notes (Signed)
ED Provider at bedside. 

## 2018-07-22 NOTE — ED Triage Notes (Signed)
Pt c/o swelling to bilat LE and "all over" x 2 days-right flank pain x today-also c/o vaginal bleeding x 3.87months-NAD-steady gait

## 2018-12-17 ENCOUNTER — Emergency Department (HOSPITAL_BASED_OUTPATIENT_CLINIC_OR_DEPARTMENT_OTHER)
Admission: EM | Admit: 2018-12-17 | Discharge: 2018-12-17 | Disposition: A | Discharge status: Home / Self Care | Payer: BLUE CROSS/BLUE SHIELD | Attending: Emergency Medicine | Admitting: Emergency Medicine

## 2018-12-17 ENCOUNTER — Emergency Department (HOSPITAL_BASED_OUTPATIENT_CLINIC_OR_DEPARTMENT_OTHER): Payer: BLUE CROSS/BLUE SHIELD

## 2018-12-17 ENCOUNTER — Encounter (HOSPITAL_BASED_OUTPATIENT_CLINIC_OR_DEPARTMENT_OTHER): Payer: Self-pay

## 2018-12-17 ENCOUNTER — Other Ambulatory Visit: Payer: Self-pay

## 2018-12-17 DIAGNOSIS — I10 Essential (primary) hypertension: Secondary | ICD-10-CM | POA: Diagnosis not present

## 2018-12-17 DIAGNOSIS — R519 Headache, unspecified: Secondary | ICD-10-CM

## 2018-12-17 MED ORDER — ACETAMINOPHEN 500 MG PO TABS
1000.0000 mg | ORAL_TABLET | Freq: Once | ORAL | Status: AC
  Administered 2018-12-17: 20:00:00 1000 mg via ORAL
  Filled 2018-12-17: qty 2

## 2018-12-17 MED ORDER — SODIUM CHLORIDE 0.9 % IV BOLUS
1000.0000 mL | Freq: Once | INTRAVENOUS | Status: AC
  Administered 2018-12-17: 20:00:00 1000 mL via INTRAVENOUS

## 2018-12-17 MED ORDER — PROCHLORPERAZINE MALEATE 10 MG PO TABS: 10.0000 mg | ORAL_TABLET | Freq: Two times a day (BID) | ORAL | 0 refills | Status: DC | PRN

## 2018-12-17 MED ORDER — PROCHLORPERAZINE EDISYLATE 10 MG/2ML IJ SOLN
10.0000 mg | Freq: Once | INTRAMUSCULAR | Status: AC
  Administered 2018-12-17: 10 mg via INTRAVENOUS
  Filled 2018-12-17: qty 2

## 2018-12-17 MED ORDER — DIPHENHYDRAMINE HCL 50 MG/ML IJ SOLN
25.0000 mg | Freq: Once | INTRAMUSCULAR | Status: AC
  Administered 2018-12-17: 25 mg via INTRAVENOUS
  Filled 2018-12-17: qty 1

## 2018-12-17 NOTE — Discharge Instructions (Addendum)
You were evaluated in the Emergency Department and after careful evaluation, we did not find any emergent condition requiring admission or further testing in the hospital.  Your exam/testing today was overall reassuring.  You can use the medication provided at home for any continued headaches as well as Tylenol.  Please return to the Emergency Department if you experience any worsening of your condition.  We encourage you to follow up with a primary care provider.  Thank you for allowing [place] to be a part of your care.

## 2018-12-17 NOTE — ED Provider Notes (Signed)
[facility] Emergency Department Provider Note MRN:  [id number]  Arrival date & time: 12/17/18     Chief Complaint   Headache   History of Present Illness   Paige Hamilton is a 38 y.o. year-old female with a history of fibromyalgia presenting to the ED with chief complaint of headache.  3 days of persistent moderate to severe headache, worse when waking up in the morning.  Feels like a pressure in the front of the forehead and face.  Denies fever, no neck pain, no chest pain, no shortness of breath, no abdominal pain, no numbness or weakness to the arms or legs.  Worse with bright lights and loud noises.  Having lack of sleep recently due to caring for her elderly and ill mother.  Review of Systems  A complete 10 system review of systems was obtained and all systems are negative except as noted in the HPI and PMH.   Patient's Health History    Past Medical History:  Diagnosis Date  . Chronic pain   . Edema leg   . Fibromyalgia   . GERD (gastroesophageal reflux disease)   . High risk HPV infection   . Hirsutism   . Hyperlipidemia   . Hypertension   . IFG (impaired fasting glucose)   . Menorrhagia   . PCOS (polycystic ovarian syndrome)   . Tuberculosis     Past Surgical History:  Procedure Laterality Date  . ABDOMINAL SURGERY    . CHOLECYSTECTOMY    . GASTRECTOMY    . LAPAROSCOPIC GASTRIC SLEEVE RESECTION      Family History  Problem Relation Age of Onset  . Heart disease Mother        CVD  . Diabetes type II Mother   . Hypertension Mother   . Hyperlipidemia Mother   . Diabetes type II Father   . Hypertension Father   . Hyperlipidemia Father   . Diabetes type II Brother   . Hypertension Brother   . Asthma Other        Niece    Social History   Socioeconomic History  . Marital status: Single    Spouse name: Not on file  . Number of children: Not on file  . Years of education: Not on file  . Highest education level: Not on file   Occupational History  . Not on file  Social Needs  . Financial resource strain: Not on file  . Food insecurity    Worry: Not on file    Inability: Not on file  . Transportation needs    Medical: Not on file    Non-medical: Not on file  Tobacco Use  . Smoking status: Never Smoker  . Smokeless tobacco: Never Used  Substance and Sexual Activity  . Alcohol use: No  . Drug use: No  . Sexual activity: Yes    Partners: Male    Birth control/protection: None  Lifestyle  . Physical activity    Days per week: Not on file    Minutes per session: Not on file  . Stress: Not on file  Relationships  . Social Herbalist on phone: Not on file    Gets together: Not on file    Attends religious service: Not on file    Active member of club or organization: Not on file    Attends meetings of clubs or organizations: Not on file    Relationship status: Not on file  . Intimate partner violence  Fear of current or ex partner: Not on file    Emotionally abused: Not on file    Physically abused: Not on file    Forced sexual activity: Not on file  Other Topics Concern  . Not on file  Social History Narrative   Marital Status: Divorced    Children:  None    Pets: Charity fundraiser ([employer])    Living Situation: Lives alone   Occupation: Technical sales engineer) Economist; Architectural technologist) [employer]     Education: Higher education careers adviser ([employer])    Alcohol Use:  None   Tobacco Use:  None    Drug Use:  None   Diet:  Regular   Exercise:  Walking    Hobbies: Reading/Cooking/Playing with her nieces and nephews           Physical Exam  Vital Signs and Nursing Notes reviewed Vitals:   12/17/18 1619 12/17/18 2012  BP: 139/76 129/74  Pulse: 96 79  Resp: 14 16  Temp: 98.4 F (36.9 C)   SpO2: 100% 100%    CONSTITUTIONAL: Well-appearing, NAD NEURO:  Alert and oriented x 3, normal and symmetric strength and sensation, normal coordination, normal speech EYES:   eyes equal and reactive ENT/NECK:  no LAD, no JVD CARDIO: Regular rate, well-perfused, normal S1 and S2 PULM:  CTAB no wheezing or rhonchi GI/GU:  normal bowel sounds, non-distended, non-tender MSK/SPINE:  No gross deformities, no edema SKIN:  no rash, atraumatic PSYCH:  Appropriate speech and behavior  Diagnostic and Interventional Summary    EKG Interpretation  Date/Time:    Ventricular Rate:    PR Interval:    QRS Duration:   QT Interval:    QTC Calculation:   R Axis:     Text Interpretation:        Labs Reviewed - No data to display  CT Head Wo Contrast  Final Result      Medications  diphenhydrAMINE (BENADRYL) injection 25 mg (25 mg Intravenous Given 12/17/18 2015)  prochlorperazine (COMPAZINE) injection 10 mg (10 mg Intravenous Given 12/17/18 2016)  sodium chloride 0.9 % bolus 1,000 mL (1,000 mLs Intravenous New Bag/Given 12/17/18 2016)  acetaminophen (TYLENOL) tablet 1,000 mg (1,000 mg Oral Given 12/17/18 2016)     Procedures  /  Critical Care Procedures  ED Course and Medical Decision Making  I have reviewed the triage vital signs and the nursing notes.  Pertinent labs & imaging results that were available during my care of the patient were reviewed by me and considered in my medical decision making (see below for details).     Suspect tension or migraine headache, but given the atypical nature and the fact that it is worse in the morning will CT to exclude mass.  CT head is normal.  Patient is feeling better after migraine cocktail.  Normal neurological exam, nothing to suggest subarachnoid hemorrhage or other emergent process.  Appropriate for discharge.  Elmer Sow. Pilar Plate, MD [facility] Emergency Medicine [facility] [email or web address]  Final Clinical Impressions(s) / ED Diagnoses     ICD-10-CM   1. Acute nonintractable headache, unspecified headache type  R51.9     ED Discharge Orders         Ordered    prochlorperazine (COMPAZINE)  10 MG tablet  2 times daily PRN     12/17/18 2131           Discharge Instructions Discussed with and Provided to Patient:     Discharge Instructions  You were evaluated in the Emergency Department and after careful evaluation, we did not find any emergent condition requiring admission or further testing in the hospital.  Your exam/testing today was overall reassuring.  You can use the medication provided at home for any continued headaches as well as Tylenol.  Please return to the Emergency Department if you experience any worsening of your condition.  We encourage you to follow up with a primary care provider.  Thank you for allowing us to be a part of your care.        Sabas SousBero, Keymora Grillot M, MD 12/17/18 2132

## 2018-12-17 NOTE — ED Notes (Signed)
Patient transported to CT 

## 2018-12-17 NOTE — ED Notes (Signed)
ED Provider at bedside. 

## 2018-12-17 NOTE — ED Triage Notes (Signed)
Pt c/o HA x 1 month ago that lasted 1 week-virtual visit with PCP with a rx that insurance did not cover-PCP was going to change-pt did not f/u-c/o current HA and face pain x 3 days-denies injury-denies fever/flu like sx

## 2019-02-08 ENCOUNTER — Emergency Department (HOSPITAL_BASED_OUTPATIENT_CLINIC_OR_DEPARTMENT_OTHER)
Admission: EM | Admit: 2019-02-08 | Discharge: 2019-02-08 | Disposition: A | Discharge status: Home / Self Care | Payer: BLUE CROSS/BLUE SHIELD | Attending: Emergency Medicine | Admitting: Emergency Medicine

## 2019-02-08 ENCOUNTER — Other Ambulatory Visit: Payer: Self-pay

## 2019-02-08 ENCOUNTER — Encounter (HOSPITAL_BASED_OUTPATIENT_CLINIC_OR_DEPARTMENT_OTHER): Payer: Self-pay | Admitting: *Deleted

## 2019-02-08 DIAGNOSIS — U071 COVID-19: Secondary | ICD-10-CM | POA: Diagnosis not present

## 2019-02-08 DIAGNOSIS — I10 Essential (primary) hypertension: Secondary | ICD-10-CM | POA: Diagnosis not present

## 2019-02-08 DIAGNOSIS — Z20822 Contact with and (suspected) exposure to covid-19: Secondary | ICD-10-CM

## 2019-02-08 DIAGNOSIS — R05 Cough: Secondary | ICD-10-CM | POA: Diagnosis present

## 2019-02-08 LAB — INFLUENZA PANEL BY PCR (TYPE A & B)
Influenza A By PCR: NEGATIVE
Influenza B By PCR: NEGATIVE

## 2019-02-08 NOTE — Discharge Instructions (Signed)
You were evaluated in the Emergency Department and after careful evaluation, we did not find any emergent condition requiring admission or further testing in the hospital.  Your exam/testing today was overall reassuring.  Your symptoms seem to be due to a viral illness, possibly coronavirus or the flu.  We have tested you for the coronavirus and the flu here in the Emergency Department.  Please isolate or quarantine at home until you receive a negative test result.  If positive, we recommend continued home quarantine per [facility] recommendations.   Please return to the Emergency Department if you experience any worsening of your condition.  We encourage you to follow up with a primary care provider.  Thank you for allowing [place] to be a part of your care.

## 2019-02-08 NOTE — ED Provider Notes (Signed)
[facility] Emergency Department Provider Note MRN:  [id number]  [place] date & time: 02/08/19     Chief Complaint   Cough History of Present Illness   Paige Hamilton is a 38 y.o. year-old female with a history of hypertension presenting to the ED with chief complaint of cough.  For the past 2 days patient is complaining of general malaise, body aches, cough, diarrhea, loss of taste and smell, fatigue, headache.  Was recently caring for both of her parents, who were in the hospital for coronavirus.  Denies shortness of breath, no chest pain, no abdominal pain.  Symptoms constant, moderate, no exacerbating relieving factors.  Review of Systems  A complete 10 system review of systems was obtained and all systems are negative except as noted in the HPI and PMH.   Patient's Health History    Past Medical History:  Diagnosis Date  . Chronic pain   . Edema leg   . Fibromyalgia   . GERD (gastroesophageal reflux disease)   . High risk HPV infection   . Hirsutism   . Hyperlipidemia   . Hypertension   . IFG (impaired fasting glucose)   . Menorrhagia   . PCOS (polycystic ovarian syndrome)   . Tuberculosis     Past Surgical History:  Procedure Laterality Date  . ABDOMINAL SURGERY    . CHOLECYSTECTOMY    . GASTRECTOMY    . LAPAROSCOPIC GASTRIC SLEEVE RESECTION      Family History  Problem Relation Age of Onset  . Heart disease Mother        CVD  . Diabetes type II Mother   . Hypertension Mother   . Hyperlipidemia Mother   . Diabetes type II Father   . Hypertension Father   . Hyperlipidemia Father   . Diabetes type II Brother   . Hypertension Brother   . Asthma Other        Niece    Social History   Socioeconomic History  . Marital status: Single    Spouse name: Not on file  . Number of children: Not on file  . Years of education: Not on file  . Highest education level: Not on file  Occupational History  . Not on file  Tobacco Use  .  Smoking status: Never Smoker  . Smokeless tobacco: Never Used  Substance and Sexual Activity  . Alcohol use: No  . Drug use: No  . Sexual activity: Yes    Partners: Male    Birth control/protection: None  Other Topics Concern  . Not on file  Social History Narrative   Marital Status: Divorced    Children:  None    Pets: Air cabin crew ([place])    Living Situation: Lives alone   Occupation: Geologist, engineering) Animator; Youth worker) [employer]     Education: Producer, television/film/video ([employer])    Alcohol Use:  None   Tobacco Use:  None    Drug Use:  None   Diet:  Regular   Exercise:  Walking    Hobbies: Reading/Cooking/Playing with her nieces and nephews         Social Determinants of Health   Financial Resource Strain:   . Difficulty of Paying Living Expenses: Not on file  Food Insecurity:   . Worried About Charity fundraiser in the Last Year: Not on file  . Ran Out of Food in the Last Year: Not on file  Transportation Needs:   . Lack of  Transportation (Medical): Not on file  . Lack of Transportation (Non-Medical): Not on file  Physical Activity:   . Days of Exercise per Week: Not on file  . Minutes of Exercise per Session: Not on file  Stress:   . Feeling of Stress : Not on file  Social Connections:   . Frequency of Communication with Friends and Family: Not on file  . Frequency of Social Gatherings with Friends and Family: Not on file  . Attends Religious Services: Not on file  . Active Member of Clubs or Organizations: Not on file  . Attends Banker Meetings: Not on file  . Marital Status: Not on file  Intimate Partner Violence:   . Fear of Current or Ex-Partner: Not on file  . Emotionally Abused: Not on file  . Physically Abused: Not on file  . Sexually Abused: Not on file     Physical Exam  Vital Signs and Nursing Notes reviewed Vitals:   02/08/19 1350 02/08/19 1407  BP: 120/86 138/88  Pulse: (!) 114 (!) 116    Resp: 20 20  Temp: 98.9 F (37.2 C) 98.3 F (36.8 C)  SpO2: 99% 98%    CONSTITUTIONAL: Well-appearing, NAD NEURO:  Alert and oriented x 3, no focal deficits EYES:  eyes equal and reactive ENT/NECK:  no LAD, no JVD CARDIO: Regular rate, well-perfused, normal S1 and S2 PULM:  CTAB no wheezing or rhonchi GI/GU:  normal bowel sounds, non-distended, non-tender MSK/SPINE:  No gross deformities, no edema SKIN:  no rash, atraumatic PSYCH:  Appropriate speech and behavior  Diagnostic and Interventional Summary    EKG Interpretation  Date/Time:    Ventricular Rate:    PR Interval:    QRS Duration:   QT Interval:    QTC Calculation:   R Axis:     Text Interpretation:        Labs Reviewed  NOVEL CORONAVIRUS, NAA (HOSP ORDER, SEND-OUT TO REF LAB; TAT 18-24 HRS)  INFLUENZA PANEL BY PCR (TYPE A & B)    No orders to display    Medications - No data to display   Procedures  /  Critical Care Procedures  ED Course and Medical Decision Making  I have reviewed the triage vital signs and the nursing notes.  Pertinent labs & imaging results that were available during my care of the patient were reviewed by me and considered in my medical decision making (see below for details).     Well-appearing, no increased work of breathing, clear lungs, vital signs reassuring, was mildly tachycardic during triage but on my evaluation heart rate is in the low 90s.  Suspect she is mildly dehydrated due to a viral illness, likely coronavirus given her exposure.  No hypoxia, appropriate for discharge with return precautions.  Paige Hamilton was evaluated in Emergency Department on 02/08/2019 for the symptoms described in the history of present illness. She was evaluated in the context of the global COVID-19 pandemic, which necessitated consideration that the patient might be at risk for infection with the SARS-CoV-2 virus that causes COVID-19. Institutional protocols and algorithms that pertain to the  evaluation of patients at risk for COVID-19 are in a state of rapid change based on information released by regulatory bodies including the CDC and federal and state organizations. These policies and algorithms were followed during the patient's care in the ED.     Elmer Sow. Pilar Plate, MD [facility] Emergency Medicine [facility] [email or web address]  Final Clinical Impressions(s) / ED  Diagnoses     ICD-10-CM   1. Suspected COVID-19 virus infection  Z20.828     ED Discharge Orders    None       Discharge Instructions Discussed with and Provided to Patient:     Discharge Instructions     You were evaluated in the Emergency Department and after careful evaluation, we did not find any emergent condition requiring admission or further testing in the hospital.  Your exam/testing today was overall reassuring.  Your symptoms seem to be due to a viral illness, possibly coronavirus or the flu.  We have tested you for the coronavirus and the flu here in the Emergency Department.  Please isolate or quarantine at home until you receive a negative test result.  If positive, we recommend continued home [facility] recommendations.   Please return to the Emergency Department if you experience any worsening of your condition.  We encourage you to follow up with a primary care provider.  Thank you for allowing us to be a part of your care.       Sabas SousBero, Paige Cirigliano M, MD 02/08/19 1539

## 2019-02-08 NOTE — ED Notes (Signed)
C/o chest pain when she coughs. VS re-evaluated.

## 2019-02-08 NOTE — ED Triage Notes (Signed)
Headache, body aches, cough, chills, fatigue, loss of taste and loss of smell.

## 2019-02-10 ENCOUNTER — Other Ambulatory Visit: Payer: Self-pay | Admitting: Nurse Practitioner

## 2019-02-10 DIAGNOSIS — U071 COVID-19: Secondary | ICD-10-CM

## 2019-02-10 DIAGNOSIS — E6609 Other obesity due to excess calories: Secondary | ICD-10-CM

## 2019-02-10 DIAGNOSIS — Z6836 Body mass index (BMI) 36.0-36.9, adult: Secondary | ICD-10-CM

## 2019-02-10 NOTE — Progress Notes (Signed)
  I connected by phone with Paige Hamilton on 02/10/2019 at 2:13 PM to discuss the potential use of an new treatment for mild to moderate COVID-19 viral infection in non-hospitalized patients.  This patient is a 38 y.o. female that meets the FDA criteria for Emergency Use Authorization of bamlanivimab or casirivimab\imdevimab.  Has a (+) direct SARS-CoV-2 viral test result  Has mild or moderate COVID-19   Is ? 39 years of age and weighs ? 40 kg  Is NOT hospitalized due to COVID-19  Is NOT requiring oxygen therapy or requiring an increase in baseline oxygen flow rate due to COVID-19  Is within 10 days of symptom onset  Has at least one of the high risk factor(s) for progression to severe COVID-19 and/or hospitalization as defined in EUA.  Specific high risk criteria : BMI >/= 35   I have spoken and communicated the following to the patient or parent/caregiver:  1. FDA has authorized the emergency use of bamlanivimab and casirivimab\imdevimab for the treatment of mild to moderate COVID-19 in adults and pediatric patients with positive results of direct SARS-CoV-2 viral testing who are 74 years of age and older weighing at least 40 kg, and who are at high risk for progressing to severe COVID-19 and/or hospitalization.  2. The significant known and potential risks and benefits of bamlanivimab and casirivimab\imdevimab, and the extent to which such potential risks and benefits are unknown.  3. Information on available alternative treatments and the risks and benefits of those alternatives, including clinical trials.  4. Patients treated with bamlanivimab and casirivimab\imdevimab should continue to self-isolate and use infection control measures (e.g., wear mask, isolate, social distance, avoid sharing personal items, clean and disinfect "high touch" surfaces, and frequent handwashing) according to CDC guidelines.   5. The patient or parent/caregiver has the option to accept or refuse  bamlanivimab or casirivimab\imdevimab .  After reviewing this information with the patient, The patient agreed to proceed with receiving the bamlanimivab infusion and will be provided a copy of the Fact sheet prior to receiving the infusion.Fenton Foy 02/10/2019 2:13 PM

## 2019-02-11 LAB — NOVEL CORONAVIRUS, NAA (HOSP ORDER, SEND-OUT TO REF LAB; TAT 18-24 HRS): SARS-CoV-2, NAA: DETECTED — AB

## 2019-02-15 ENCOUNTER — Ambulatory Visit (HOSPITAL_COMMUNITY)
Admission: RE | Admit: 2019-02-15 | Discharge: 2019-02-15 | Disposition: A | Discharge status: Home / Self Care | Payer: BLUE CROSS/BLUE SHIELD | Source: Ambulatory Visit | Attending: Pulmonary Disease | Admitting: Pulmonary Disease

## 2019-02-15 DIAGNOSIS — E6609 Other obesity due to excess calories: Secondary | ICD-10-CM | POA: Insufficient documentation

## 2019-02-15 DIAGNOSIS — U071 COVID-19: Secondary | ICD-10-CM | POA: Insufficient documentation

## 2019-02-15 DIAGNOSIS — Z6836 Body mass index (BMI) 36.0-36.9, adult: Secondary | ICD-10-CM | POA: Diagnosis present

## 2019-02-15 MED ORDER — FAMOTIDINE IN NACL 20-0.9 MG/50ML-% IV SOLN: 20.0000 mg | Freq: Once | INTRAVENOUS | Status: DC | PRN

## 2019-02-15 MED ORDER — ALBUTEROL SULFATE HFA 108 (90 BASE) MCG/ACT IN AERS: 2.0000 | INHALATION_SPRAY | Freq: Once | RESPIRATORY_TRACT | Status: DC | PRN

## 2019-02-15 MED ORDER — EPINEPHRINE 0.3 MG/0.3ML IJ SOAJ: 0.3000 mg | Freq: Once | INTRAMUSCULAR | Status: DC | PRN

## 2019-02-15 MED ORDER — SODIUM CHLORIDE 0.9 % IV SOLN
INTRAVENOUS | Status: DC | PRN
  Administered 2019-02-15: 250 mL via INTRAVENOUS

## 2019-02-15 MED ORDER — METHYLPREDNISOLONE SODIUM SUCC 125 MG IJ SOLR: 125.0000 mg | Freq: Once | INTRAMUSCULAR | Status: DC | PRN

## 2019-02-15 MED ORDER — SODIUM CHLORIDE 0.9 % IV SOLN
700.0000 mg | Freq: Once | INTRAVENOUS | Status: AC
  Administered 2019-02-15: 700 mg via INTRAVENOUS
  Filled 2019-02-15: qty 20

## 2019-02-15 MED ORDER — DIPHENHYDRAMINE HCL 50 MG/ML IJ SOLN: 50.0000 mg | Freq: Once | INTRAMUSCULAR | Status: DC | PRN

## 2019-02-15 NOTE — Discharge Instructions (Signed)

## 2019-02-15 NOTE — Progress Notes (Signed)
  Diagnosis: COVID-19  Physician: Hinton Dyer. Zanard, MD  Procedure: [facility] Med: bamlanivimab infusion - Provided patient with bamlanimivab fact sheet for patients, parents and caregivers prior to infusion.  Complications: No immediate complications noted.  Discharge: Discharged home   Cherlynn Perches 02/15/2019

## 2019-09-01 ENCOUNTER — Encounter (HOSPITAL_BASED_OUTPATIENT_CLINIC_OR_DEPARTMENT_OTHER): Payer: Self-pay | Admitting: *Deleted

## 2019-09-01 ENCOUNTER — Emergency Department (HOSPITAL_BASED_OUTPATIENT_CLINIC_OR_DEPARTMENT_OTHER): Payer: BLUE CROSS/BLUE SHIELD

## 2019-09-01 ENCOUNTER — Other Ambulatory Visit: Payer: Self-pay

## 2019-09-01 ENCOUNTER — Emergency Department (HOSPITAL_BASED_OUTPATIENT_CLINIC_OR_DEPARTMENT_OTHER)
Admission: EM | Admit: 2019-09-01 | Discharge: 2019-09-01 | Disposition: A | Discharge status: Home / Self Care | Payer: BLUE CROSS/BLUE SHIELD | Attending: Emergency Medicine | Admitting: Emergency Medicine

## 2019-09-01 DIAGNOSIS — N939 Abnormal uterine and vaginal bleeding, unspecified: Secondary | ICD-10-CM | POA: Diagnosis not present

## 2019-09-01 DIAGNOSIS — R072 Precordial pain: Secondary | ICD-10-CM | POA: Diagnosis not present

## 2019-09-01 DIAGNOSIS — I10 Essential (primary) hypertension: Secondary | ICD-10-CM | POA: Diagnosis not present

## 2019-09-01 DIAGNOSIS — R0789 Other chest pain: Secondary | ICD-10-CM | POA: Diagnosis present

## 2019-09-01 LAB — URINALYSIS, ROUTINE W REFLEX MICROSCOPIC
Glucose, UA: NEGATIVE mg/dL
Ketones, ur: NEGATIVE mg/dL
Leukocytes,Ua: NEGATIVE
Nitrite: NEGATIVE
Protein, ur: 30 mg/dL — AB
Specific Gravity, Urine: >1.03 — ABNORMAL HIGH (ref 1.005–1.030)
pH: 5.5 (ref 5.0–8.0)

## 2019-09-01 LAB — CBC WITH DIFFERENTIAL/PLATELET
Abs Immature Granulocytes: 0.01 10*3/uL (ref 0.00–0.07)
Basophils Absolute: 0 10*3/uL (ref 0.0–0.1)
Basophils Relative: 1 %
Eosinophils Absolute: 0.1 10*3/uL (ref 0.0–0.5)
Eosinophils Relative: 2 %
HCT: 40.8 % (ref 36.0–46.0)
Hemoglobin: 13.4 g/dL (ref 12.0–15.0)
Immature Granulocytes: 0 %
Lymphocytes Relative: 46 %
Lymphs Abs: 2.7 10*3/uL (ref 0.7–4.0)
MCH: 28.8 pg (ref 26.0–34.0)
MCHC: 32.8 g/dL (ref 30.0–36.0)
MCV: 87.6 fL (ref 80.0–100.0)
Monocytes Absolute: 0.4 10*3/uL (ref 0.1–1.0)
Monocytes Relative: 6 %
Neutro Abs: 2.7 10*3/uL (ref 1.7–7.7)
Neutrophils Relative %: 45 %
Platelets: 285 10*3/uL (ref 150–400)
RBC: 4.66 MIL/uL (ref 3.87–5.11)
RDW: 13.3 % (ref 11.5–15.5)
WBC: 5.9 10*3/uL (ref 4.0–10.5)
nRBC: 0 % (ref 0.0–0.2)

## 2019-09-01 LAB — COMPREHENSIVE METABOLIC PANEL
ALT: 20 U/L (ref 0–44)
AST: 17 U/L (ref 15–41)
Albumin: 4.2 g/dL (ref 3.5–5.0)
Alkaline Phosphatase: 59 U/L (ref 38–126)
Anion gap: 10 (ref 5–15)
BUN: 18 mg/dL (ref 6–20)
CO2: 23 mmol/L (ref 22–32)
Calcium: 8.5 mg/dL — ABNORMAL LOW (ref 8.9–10.3)
Chloride: 106 mmol/L (ref 98–111)
Creatinine, Ser: 0.56 mg/dL (ref 0.44–1.00)
GFR calc Af Amer: >60 mL/min (ref >60)
GFR calc non Af Amer: >60 mL/min (ref >60)
Glucose, Bld: 111 mg/dL — ABNORMAL HIGH (ref 70–99)
Potassium: 3.7 mmol/L (ref 3.5–5.1)
Sodium: 139 mmol/L (ref 135–145)
Total Bilirubin: 0.6 mg/dL (ref 0.3–1.2)
Total Protein: 7.2 g/dL (ref 6.5–8.1)

## 2019-09-01 LAB — URINALYSIS, MICROSCOPIC (REFLEX): RBC / HPF: >50 RBC/hpf (ref 0–5)

## 2019-09-01 LAB — TROPONIN I (HIGH SENSITIVITY): Troponin I (High Sensitivity): <2 ng/L (ref <18)

## 2019-09-01 LAB — PREGNANCY, URINE: Preg Test, Ur: NEGATIVE

## 2019-09-01 LAB — D-DIMER, QUANTITATIVE: D-Dimer, Quant: <0.27 ug/mL-FEU (ref 0.00–0.50)

## 2019-09-01 NOTE — ED Notes (Signed)
Chest pain complaints since her mother passed, march 26th of this year. Feels like tension, stress, aching type pain at left chest, also has back pain. Has vaginal bleeding.

## 2019-09-01 NOTE — ED Notes (Signed)
EDP @ bedside 

## 2019-09-01 NOTE — Discharge Instructions (Signed)
You were seen in the emergency room today with chest discomfort.  Your lab work here is reassuring.  Please follow close with your primary care doctor and OB/GYN regarding your chest pain symptoms or vaginal bleeding symptoms.  They may need to consider starting on medications to help stop the vaginal bleeding.  If your symptoms become suddenly worse, change, or you become more concerned please return to the emergency department for reevaluation.

## 2019-09-01 NOTE — ED Notes (Signed)
Patient transported to X-ray 

## 2019-09-01 NOTE — ED Triage Notes (Signed)
Pt co left sided chest pain x 3 weeks, also c/o lower back pain x 2 weeks

## 2019-09-01 NOTE — ED Provider Notes (Signed)
Emergency Department Provider Note   I have reviewed the triage vital signs and the nursing notes.   HISTORY  Chief Complaint Chest Pain   HPI Paige Hamilton is a 39 y.o. female with past medical history reviewed below presents to the emergency department with intermittent chest heaviness/tightness over the past several weeks. Patient notes that she had her mother passed away in May 15, 2022 and has had symptoms similar to this intermittently since then. Symptoms seem worse over the past couple of weeks. The discomfort is frequently triggered by emotional distress as she continues to grieve. She called and discussed symptoms with her primary care doctor who referred her to the emergency department for further evaluation.  Patient also notes that she is having some heavy menstrual cycle bleeding. She had the normal start of her menstrual cycle 2 weeks ago but symptoms have continued. She had an issue like this in the past and followed with her OB/GYN and symptoms ultimately stopped. She was not started on oral contraception at that time.  With her chest pain she is not experiencing shortness of breath, fever, chills, cough. No vaginal discharge. She is having some discomfort in her lower abdomen and back similar to menstrual cramping pain she is experienced in the past.    Past Medical History:  Diagnosis Date  . Chronic pain   . Edema leg   . Fibromyalgia   . GERD (gastroesophageal reflux disease)   . High risk HPV infection   . Hirsutism   . Hyperlipidemia   . Hypertension   . IFG (impaired fasting glucose)   . Menorrhagia   . PCOS (polycystic ovarian syndrome)   . Tuberculosis     Patient Active Problem List   Diagnosis Date Noted  . Other malaise and fatigue 01/16/2013  . Depression, acute 01/16/2013  . Nausea alone 05/04/2012  . Obesity, unspecified 05/04/2012    Past Surgical History:  Procedure Laterality Date  . ABDOMINAL SURGERY    . CHOLECYSTECTOMY    .  GASTRECTOMY    . LAPAROSCOPIC GASTRIC SLEEVE RESECTION      Allergies Ibuprofen and Naproxen  Family History  Problem Relation Age of Onset  . Heart disease Mother        CVD  . Diabetes type II Mother   . Hypertension Mother   . Hyperlipidemia Mother   . Diabetes type II Father   . Hypertension Father   . Hyperlipidemia Father   . Diabetes type II Brother   . Hypertension Brother   . Asthma Other        Niece    Social History Social History   Tobacco Use  . Smoking status: Never Smoker  . Smokeless tobacco: Never Used  Vaping Use  . Vaping Use: Never used  Substance Use Topics  . Alcohol use: No  . Drug use: No    Review of Systems  Constitutional: No fever/chills Eyes: No visual changes. ENT: No sore throat. Cardiovascular: Positive chest pain. Respiratory: Denies shortness of breath. Gastrointestinal: No abdominal pain.  No nausea, no vomiting.  No diarrhea.  No constipation. Genitourinary: Negative for dysuria. Positive vaginal bleeding.  Musculoskeletal: Negative for back pain. Skin: Negative for rash. Neurological: Negative for headaches, focal weakness or numbness.  10-point ROS otherwise negative.  ____________________________________________   PHYSICAL EXAM:  VITAL SIGNS: ED Triage Vitals  Enc Vitals Group     BP 09/01/19 1552 130/71     Pulse Rate 09/01/19 1552 77     Resp 09/01/19  1552 18     Temp 09/01/19 1554 98.6 F (37 C)     Temp Source 09/01/19 1552 Oral     SpO2 09/01/19 1552 100 %     Weight 09/01/19 1551 210 lb (95.3 kg)     Height 09/01/19 1551 5\' 8"  (1.727 m)   Constitutional: Alert and oriented. Well appearing and in no acute distress. Eyes: Conjunctivae are normal.  Head: Atraumatic. Nose: No congestion/rhinnorhea. Mouth/Throat: Mucous membranes are moist.   Neck: No stridor.   Cardiovascular: Normal rate, regular rhythm. Good peripheral circulation. Grossly normal heart sounds.   Respiratory: Normal respiratory  effort.  No retractions. Lungs CTAB. Gastrointestinal: Soft and nontender. No distention. No CVA tenderness.  Musculoskeletal: No lower extremity tenderness nor edema. No gross deformities of extremities. Neurologic:  Normal speech and language.  Skin:  Skin is warm, dry and intact. No rash noted.  ____________________________________________   LABS (all labs ordered are listed, but only abnormal results are displayed)  Labs Reviewed  COMPREHENSIVE METABOLIC PANEL - Abnormal; Notable for the following components:      Result Value   Glucose, Bld 111 (*)    Calcium 8.5 (*)    All other components within normal limits  URINALYSIS, ROUTINE W REFLEX MICROSCOPIC - Abnormal; Notable for the following components:   Color, Urine BROWN (*)    APPearance CLOUDY (*)    Specific Gravity, Urine >1.030 (*)    Hgb urine dipstick LARGE (*)    Bilirubin Urine SMALL (*)    Protein, ur 30 (*)    All other components within normal limits  URINALYSIS, MICROSCOPIC (REFLEX) - Abnormal; Notable for the following components:   Bacteria, UA MANY (*)    All other components within normal limits  CBC WITH DIFFERENTIAL/PLATELET  D-DIMER, QUANTITATIVE (NOT AT [facility])  PREGNANCY, URINE  TROPONIN I (HIGH SENSITIVITY)   ____________________________________________  EKG   EKG Interpretation  Date/Time:  Wednesday September 01 2019 15:52:27 EDT Ventricular Rate:  81 PR Interval:  146 QRS Duration: 82 QT Interval:  338 QTC Calculation: 392 R Axis:   54 Text Interpretation: Normal sinus rhythm Nonspecific T wave abnormality Abnormal ECG No STEMI Confirmed by [id number] [phone]) on 09/01/2019 4:02:05 PM       ____________________________________________  RADIOLOGY  DG Chest 2 View  Result Date: 09/01/2019 CLINICAL DATA:  Chest pain. Additional history provided: Patient reports left-sided chest pain for 3 weeks, low back pain for 2 weeks. EXAM: CHEST - 2 VIEW COMPARISON:  Prior chest radiographs  02/15/2019 and earlier FINDINGS: Heart size within normal limits. No appreciable airspace consolidation or pulmonary edema. No evidence of pleural effusion or pneumothorax. No acute bony abnormality identified. IMPRESSION: No evidence of active cardiopulmonary disease. Electronically Signed   By: 04/15/2019 DO   On: 09/01/2019 16:15    ____________________________________________   PROCEDURES  Procedure(s) performed:   Procedures  None ____________________________________________   INITIAL IMPRESSION / ASSESSMENT AND PLAN / ED COURSE  Pertinent labs & imaging results that were available during my care of the patient were reviewed by me and considered in my medical decision making (see chart for details).   Patient presents emergency department intermittent chest discomfort worsening over the past several days to weeks. Her left leg pain and swelling are chronic and have been present for years and not specifically worse than normal. I do not plan for repeat DVT ultrasound but I have added on a D-dimer in addition to work-up performed in triage including troponin. Chest  x-ray is clear after review. Patient with abnormal uterine bleeding in the past and prefers to follow closely with her OB/GYN for further treatment and discussion of oral contraception. Not hemodynamically unstable here. Hemoglobin is 13.4. Initial troponin negative. Urine testing and pregnancy pending. D-dimer pending.  D-dimer negative. UA and pregnancy negative. Blood on UA noted and likely from uterine bleeding. Discussed OCP and exam here but patient's plans to f/u with PCP and OB with similar issues in the past. Discussed ED return precautions.  ____________________________________________  FINAL CLINICAL IMPRESSION(S) / ED DIAGNOSES  Final diagnoses:  Precordial chest pain  Vaginal bleeding    Note:  This document was prepared using Dragon voice recognition software and may include unintentional dictation  errors.  Alona Bene, MD, [facility]    Lindberg Zenon, Arlyss Repress, MD 09/02/19 1200

## 2020-05-23 ENCOUNTER — Emergency Department (HOSPITAL_BASED_OUTPATIENT_CLINIC_OR_DEPARTMENT_OTHER)
Admission: EM | Admit: 2020-05-23 | Discharge: 2020-05-24 | Disposition: A | Discharge status: Home / Self Care | Payer: PRIVATE HEALTH INSURANCE | Attending: Emergency Medicine | Admitting: Emergency Medicine

## 2020-05-23 ENCOUNTER — Other Ambulatory Visit: Payer: Self-pay

## 2020-05-23 ENCOUNTER — Encounter (HOSPITAL_BASED_OUTPATIENT_CLINIC_OR_DEPARTMENT_OTHER): Payer: Self-pay

## 2020-05-23 DIAGNOSIS — I10 Essential (primary) hypertension: Secondary | ICD-10-CM | POA: Diagnosis not present

## 2020-05-23 DIAGNOSIS — R109 Unspecified abdominal pain: Secondary | ICD-10-CM

## 2020-05-23 DIAGNOSIS — K219 Gastro-esophageal reflux disease without esophagitis: Secondary | ICD-10-CM | POA: Diagnosis not present

## 2020-05-23 DIAGNOSIS — Z20822 Contact with and (suspected) exposure to covid-19: Secondary | ICD-10-CM | POA: Diagnosis not present

## 2020-05-23 DIAGNOSIS — R10A0 Flank pain, unspecified side: Secondary | ICD-10-CM

## 2020-05-23 LAB — PREGNANCY, URINE: Preg Test, Ur: NEGATIVE

## 2020-05-23 LAB — CBC WITH DIFFERENTIAL/PLATELET
Abs Immature Granulocytes: 0.05 10*3/uL (ref 0.00–0.07)
Basophils Absolute: 0.1 10*3/uL (ref 0.0–0.1)
Basophils Relative: 1 %
Eosinophils Absolute: 0.1 10*3/uL (ref 0.0–0.5)
Eosinophils Relative: 1 %
HCT: 40 % (ref 36.0–46.0)
Hemoglobin: 13.2 g/dL (ref 12.0–15.0)
Immature Granulocytes: 1 %
Lymphocytes Relative: 30 %
Lymphs Abs: 3 10*3/uL (ref 0.7–4.0)
MCH: 28.6 pg (ref 26.0–34.0)
MCHC: 33 g/dL (ref 30.0–36.0)
MCV: 86.8 fL (ref 80.0–100.0)
Monocytes Absolute: 0.6 10*3/uL (ref 0.1–1.0)
Monocytes Relative: 6 %
Neutro Abs: 6.1 10*3/uL (ref 1.7–7.7)
Neutrophils Relative %: 61 %
Platelets: 300 10*3/uL (ref 150–400)
RBC: 4.61 MIL/uL (ref 3.87–5.11)
RDW: 14 % (ref 11.5–15.5)
WBC: 9.9 10*3/uL (ref 4.0–10.5)
nRBC: 0 % (ref 0.0–0.2)

## 2020-05-23 LAB — COMPREHENSIVE METABOLIC PANEL
ALT: 24 U/L (ref 0–44)
AST: 19 U/L (ref 15–41)
Albumin: 3.7 g/dL (ref 3.5–5.0)
Alkaline Phosphatase: 69 U/L (ref 38–126)
Anion gap: 12 (ref 5–15)
BUN: 18 mg/dL (ref 6–20)
CO2: 22 mmol/L (ref 22–32)
Calcium: 8.4 mg/dL — ABNORMAL LOW (ref 8.9–10.3)
Chloride: 102 mmol/L (ref 98–111)
Creatinine, Ser: 0.59 mg/dL (ref 0.44–1.00)
GFR, Estimated: >60 mL/min (ref >60)
Glucose, Bld: 149 mg/dL — ABNORMAL HIGH (ref 70–99)
Potassium: 3.3 mmol/L — ABNORMAL LOW (ref 3.5–5.1)
Sodium: 136 mmol/L (ref 135–145)
Total Bilirubin: 0.2 mg/dL — ABNORMAL LOW (ref 0.3–1.2)
Total Protein: 7.2 g/dL (ref 6.5–8.1)

## 2020-05-23 LAB — URINALYSIS, ROUTINE W REFLEX MICROSCOPIC
Bilirubin Urine: NEGATIVE
Glucose, UA: NEGATIVE mg/dL
Hgb urine dipstick: NEGATIVE
Ketones, ur: NEGATIVE mg/dL
Leukocytes,Ua: NEGATIVE
Nitrite: NEGATIVE
Protein, ur: NEGATIVE mg/dL
Specific Gravity, Urine: >1.03 — ABNORMAL HIGH (ref 1.005–1.030)
pH: 6 (ref 5.0–8.0)

## 2020-05-23 LAB — LIPASE, BLOOD: Lipase: 46 U/L (ref 11–51)

## 2020-05-23 MED ORDER — ACETAMINOPHEN 500 MG PO TABS
1000.0000 mg | ORAL_TABLET | Freq: Once | ORAL | Status: AC
  Administered 2020-05-23: 1000 mg via ORAL
  Filled 2020-05-23: qty 2

## 2020-05-23 MED ORDER — FENTANYL CITRATE (PF) 100 MCG/2ML IJ SOLN
50.0000 ug | Freq: Once | INTRAMUSCULAR | Status: AC
Start: 2020-05-23 — End: 2020-05-23
  Administered 2020-05-23: 50 ug via INTRAVENOUS
  Filled 2020-05-23: qty 2

## 2020-05-23 NOTE — ED Triage Notes (Addendum)
Pt c/o bilat flank, lower back, lower abd pain x 3 days-bilat LE swelling and fever x 2 days, increase UO and cramps in calves last night-NAD-steady gait

## 2020-05-23 NOTE — ED Provider Notes (Signed)
[facility] EMERGENCY DEPARTMENT Provider Note  CSN: [id number] Arrival date & time: 05/23/20 2210  Chief Complaint(s) Flank Pain  HPI Paige Hamilton is a 40 y.o. female   The history is provided by the patient.  Flank Pain This is a new problem. The current episode started 2 days ago. The problem occurs constantly. The problem has been gradually worsening. Associated symptoms include abdominal pain. Pertinent negatives include no chest pain, no headaches and no shortness of breath. The symptoms are aggravated by bending and twisting. Nothing relieves the symptoms.    Past Medical History Past Medical History:  Diagnosis Date  . Chronic pain   . Edema leg   . Fibromyalgia   . GERD (gastroesophageal reflux disease)   . High risk HPV infection   . Hirsutism   . Hyperlipidemia   . Hypertension   . IFG (impaired fasting glucose)   . Menorrhagia   . PCOS (polycystic ovarian syndrome)   . Tuberculosis    Patient Active Problem List   Diagnosis Date Noted  . Other malaise and fatigue 01/16/2013  . Depression, acute 01/16/2013  . Nausea alone 05/04/2012  . Obesity, unspecified 05/04/2012   Home Medication(s) Prior to Admission medications   Medication Sig Start Date End Date Taking? Authorizing Provider  acetaminophen-codeine 120-12 MG/5ML solution Take 10 mLs by mouth every 4 (four) hours as needed for moderate pain. 02/01/18   Lawyer, Cristal Deer, PA-C  cyclobenzaprine (FLEXERIL) 10 MG tablet Take 1 tablet (10 mg total) by mouth 2 (two) times daily as needed for muscle spasms. 07/22/18   Alvira Monday, MD  dicyclomine (BENTYL) 20 MG tablet Take 1 tablet (20 mg total) by mouth 2 (two) times daily. 07/22/18   Alvira Monday, MD  doxycycline (VIBRAMYCIN) 100 MG capsule Take 1 capsule (100 mg total) by mouth 2 (two) times daily. 02/01/18   Lawyer, Cristal Deer, PA-C  DULoxetine HCl (CYMBALTA PO) Take by mouth.    [provider]  famotidine (PEPCID) 20 MG  tablet Take 1 tablet (20 mg total) by mouth 2 (two) times daily for 5 days. 03/14/17 03/19/17  Joy, Shawn C, PA-C  Guaifenesin 1200 MG TB12 Take 1 tablet (1,200 mg total) by mouth 2 (two) times daily. 02/01/18   Lawyer, Cristal Deer, PA-C  Lisdexamfetamine Dimesylate (VYVANSE PO) Take by mouth.    [provider]  omeprazole (PRILOSEC) 20 MG capsule Take 1 capsule (20 mg total) by mouth daily. 03/14/17 05/13/17  Joy, Shawn C, PA-C  ondansetron (ZOFRAN ODT) 4 MG disintegrating tablet Take 1 tablet (4 mg total) by mouth every 8 (eight) hours as needed for nausea or vomiting. 03/14/17   Joy, Shawn C, PA-C  pantoprazole (PROTONIX) 20 MG tablet Take 1 tablet (20 mg total) by mouth 2 (two) times daily. 10/21/17 11/20/17  Mortis, Jerrel Ivory I, PA-C  prochlorperazine (COMPAZINE) 10 MG tablet Take 1 tablet (10 mg total) by mouth 2 (two) times daily as needed for nausea. 12/17/18   Sabas Sous, MD  ROPINIRole HCl (REQUIP PO) Take by mouth.    [provider]  TIZANIDINE HCL PO Take by mouth.    [provider]  Past Surgical History Past Surgical History:  Procedure Laterality Date  . ABDOMINAL SURGERY    . CHOLECYSTECTOMY    . GASTRECTOMY    . LAPAROSCOPIC GASTRIC SLEEVE RESECTION     Family History Family History  Problem Relation Age of Onset  . Heart disease Mother        CVD  . Diabetes type II Mother   . Hypertension Mother   . Hyperlipidemia Mother   . Diabetes type II Father   . Hypertension Father   . Hyperlipidemia Father   . Diabetes type II Brother   . Hypertension Brother   . Asthma Other        Niece    Social History Social History   Tobacco Use  . Smoking status: Never Smoker  . Smokeless tobacco: Never Used  Vaping Use  . Vaping Use: Never used  Substance Use Topics  . Alcohol use: No  . Drug use: No    Allergies Ibuprofen and Naproxen  Review of Systems Review of Systems  Constitutional: Positive for chills, fatigue and fever.  Respiratory: Negative for shortness of breath.   Cardiovascular: Negative for chest pain.  Gastrointestinal: Positive for abdominal pain.  Genitourinary: Positive for flank pain and frequency. Negative for dysuria, vaginal bleeding and vaginal discharge.  Musculoskeletal: Positive for myalgias.  Neurological: Negative for headaches.   All other systems are reviewed and are negative for acute change except as noted in the HPI  Physical Exam Vital Signs  I have reviewed the triage vital signs BP (!) 153/83 (BP Location: Right Arm)   Pulse (!) 102   Temp 99.1 F (37.3 C) (Oral)   Resp 17   Ht 5\' 9"  (1.753 m)   Wt 112.2 kg   LMP 05/06/2020   SpO2 98%   BMI 36.53 kg/m   Physical Exam Vitals reviewed.  Constitutional:      General: She is not in acute distress.    Appearance: She is well-developed. She is not diaphoretic.  HENT:     Head: Normocephalic and atraumatic.     Nose: Nose normal.  Eyes:     General: No scleral icterus.       Right eye: No discharge.        Left eye: No discharge.     Conjunctiva/sclera: Conjunctivae normal.     Pupils: Pupils are equal, round, and reactive to light.  Cardiovascular:     Rate and Rhythm: Normal rate and regular rhythm.     Heart sounds: No murmur heard. No friction rub. No gallop.   Pulmonary:     Effort: Pulmonary effort is normal. No respiratory distress.     Breath sounds: Normal breath sounds. No stridor. No rales.  Abdominal:     General: There is no distension.     Palpations: Abdomen is soft.     Tenderness: There is abdominal tenderness in the right lower quadrant, suprapubic area and left lower quadrant. There is right CVA tenderness and left CVA tenderness.  Musculoskeletal:        General: No tenderness.     Cervical back: Normal range of motion and neck supple.  Skin:    General:  Skin is warm and dry.     Findings: No erythema or rash.  Neurological:     Mental Status: She is alert and oriented to person, place, and time.     ED Results and Treatments Labs (all labs ordered are listed, but only abnormal results are displayed) Labs  Reviewed  WET PREP, GENITAL - Abnormal; Notable for the following components:      Result Value   WBC, Wet Prep HPF POC MANY (*)    All other components within normal limits  URINALYSIS, ROUTINE W REFLEX MICROSCOPIC - Abnormal; Notable for the following components:   APPearance HAZY (*)    Specific Gravity, Urine >1.030 (*)    All other components within normal limits  COMPREHENSIVE METABOLIC PANEL - Abnormal; Notable for the following components:   Potassium 3.3 (*)    Glucose, Bld 149 (*)    Calcium 8.4 (*)    Total Bilirubin 0.2 (*)    All other components within normal limits  SARS CORONAVIRUS 2 (TAT 6-24 HRS)  PREGNANCY, URINE  CBC WITH DIFFERENTIAL/PLATELET  LIPASE, BLOOD  GC/CHLAMYDIA PROBE AMP ([place]) NOT AT [facility]                                                                                                                         EKG  EKG Interpretation  Date/Time:    Ventricular Rate:    PR Interval:    QRS Duration:   QT Interval:    QTC Calculation:   R Axis:     Text Interpretation:        Radiology No results found.  Pertinent labs & imaging results that were available during my care of the patient were reviewed by me and considered in my medical decision making (see chart for details).  Medications Ordered in ED Medications  acetaminophen (TYLENOL) tablet 1,000 mg (1,000 mg Oral Given 05/23/20 2347)  fentaNYL (SUBLIMAZE) injection 50 mcg (50 mcg Intravenous Given 05/23/20 2348)                                                                                                                                    Procedures Procedures  (including critical care time)  Medical Decision Making /  ED Course I have reviewed the nursing notes for this encounter and the patient's prior records (if available in EHR or on provided paperwork).   Paige Hamilton was evaluated in Emergency Department on 05/24/2020 for the symptoms described in the history of present illness. She was evaluated in the context of the global COVID-19 pandemic, which necessitated consideration that the patient might be at risk for infection with the SARS-CoV-2 virus that causes COVID-19. Institutional protocols and algorithms that pertain to  the evaluation of patients at risk for COVID-19 are in a state of rapid change based on information released by regulatory bodies including the CDC and federal and state organizations. These policies and algorithms were followed during the patient's care in the ED.  Bilateral flank pain and frequency with reported fevers and myalgia. Bilateral CVA and lower abdominal tenderness to palpation without evidence of peritonitis.  Pelvic exam without evidence of cervicitis or PID. Labs grossly reassuring without leukocytosis or anemia.  No significant electrolyte derangements or renal sufficiency.  No evidence of biliary obstruction or pancreatitis. Urine pregnancy negative ruling out ectopic pregnancy. UA without evidence of infection.  Patient treated symptomatically and reported significant relief. On repeat exam abdomen was benign. Likely viral process resulting in myalgias.  Patient tolerating oral intake.      Final Clinical Impression(s) / ED Diagnoses Final diagnoses:  Flank pain   The patient appears reasonably screened and/or stabilized for discharge and I doubt any other medical condition or other [facility] requiring further screening, evaluation, or treatment in the ED at this time prior to discharge. Safe for discharge with strict return precautions.  Disposition: Discharge  Condition: Good  I have discussed the results, Dx and Tx plan with the patient/family who expressed  understanding and agree(s) with the plan. Discharge instructions discussed at length. The patient/family was given strict return precautions who verbalized understanding of the instructions. No further questions at time of discharge.    ED Discharge Orders    None         Follow Up: Gillian ScarceZanard, Robyn K, MD [address] [place] [id number] [phone]  Call  if symptoms do not improve or  worsen      This chart was dictated using voice recognition software.  Despite best efforts to proofread,  errors can occur which can change the documentation meaning.   Nira Connardama, Tin Engram Eduardo, MD 05/24/20 [phone]

## 2020-05-23 NOTE — ED Notes (Signed)
Patient ambulatory to room 07. Vitals taken. Pt reports bilateral flank pain and swelling to feet. A&Ox4. Respirations regular/unlabored. PIV/Labs obtained and sent to lab. Connected to bp and pulse ox. Stretcher low, wheels locked, call bell within reach.

## 2020-05-24 LAB — WET PREP, GENITAL
Clue Cells Wet Prep HPF POC: NONE SEEN
Sperm: NONE SEEN
Trich, Wet Prep: NONE SEEN
Yeast Wet Prep HPF POC: NONE SEEN

## 2020-05-24 LAB — SARS CORONAVIRUS 2 (TAT 6-24 HRS): SARS Coronavirus 2: NEGATIVE

## 2020-05-25 LAB — GC/CHLAMYDIA PROBE AMP (~~LOC~~) NOT AT ARMC
Chlamydia: NEGATIVE
Comment: NEGATIVE
Comment: NORMAL
Neisseria Gonorrhea: NEGATIVE

## 2020-07-19 ENCOUNTER — Other Ambulatory Visit: Payer: Self-pay

## 2020-07-19 ENCOUNTER — Emergency Department (HOSPITAL_BASED_OUTPATIENT_CLINIC_OR_DEPARTMENT_OTHER)
Admission: EM | Admit: 2020-07-19 | Discharge: 2020-07-19 | Disposition: A | Discharge status: Home / Self Care | Payer: BLUE CROSS/BLUE SHIELD | Attending: Emergency Medicine | Admitting: Emergency Medicine

## 2020-07-19 ENCOUNTER — Encounter (HOSPITAL_BASED_OUTPATIENT_CLINIC_OR_DEPARTMENT_OTHER): Payer: Self-pay

## 2020-07-19 DIAGNOSIS — I10 Essential (primary) hypertension: Secondary | ICD-10-CM | POA: Diagnosis not present

## 2020-07-19 DIAGNOSIS — R112 Nausea with vomiting, unspecified: Secondary | ICD-10-CM | POA: Diagnosis not present

## 2020-07-19 DIAGNOSIS — M7918 Myalgia, other site: Secondary | ICD-10-CM | POA: Insufficient documentation

## 2020-07-19 DIAGNOSIS — M791 Myalgia, unspecified site: Secondary | ICD-10-CM

## 2020-07-19 LAB — URINALYSIS, ROUTINE W REFLEX MICROSCOPIC
Bilirubin Urine: NEGATIVE
Glucose, UA: NEGATIVE mg/dL
Hgb urine dipstick: NEGATIVE
Ketones, ur: NEGATIVE mg/dL
Leukocytes,Ua: NEGATIVE
Nitrite: NEGATIVE
Protein, ur: NEGATIVE mg/dL
Specific Gravity, Urine: 1.02 (ref 1.005–1.030)
pH: 5.5 (ref 5.0–8.0)

## 2020-07-19 LAB — CBC WITH DIFFERENTIAL/PLATELET
Abs Immature Granulocytes: 0.02 10*3/uL (ref 0.00–0.07)
Basophils Absolute: 0 10*3/uL (ref 0.0–0.1)
Basophils Relative: 1 %
Eosinophils Absolute: 0.1 10*3/uL (ref 0.0–0.5)
Eosinophils Relative: 1 %
HCT: 38.4 % (ref 36.0–46.0)
Hemoglobin: 13 g/dL (ref 12.0–15.0)
Immature Granulocytes: 0 %
Lymphocytes Relative: 51 %
Lymphs Abs: 3.3 10*3/uL (ref 0.7–4.0)
MCH: 28.8 pg (ref 26.0–34.0)
MCHC: 33.9 g/dL (ref 30.0–36.0)
MCV: 85.1 fL (ref 80.0–100.0)
Monocytes Absolute: 0.4 10*3/uL (ref 0.1–1.0)
Monocytes Relative: 7 %
Neutro Abs: 2.6 10*3/uL (ref 1.7–7.7)
Neutrophils Relative %: 40 %
Platelets: 338 10*3/uL (ref 150–400)
RBC: 4.51 MIL/uL (ref 3.87–5.11)
RDW: 13.2 % (ref 11.5–15.5)
WBC: 6.4 10*3/uL (ref 4.0–10.5)
nRBC: 0 % (ref 0.0–0.2)

## 2020-07-19 LAB — PREGNANCY, URINE: Preg Test, Ur: NEGATIVE

## 2020-07-19 LAB — BASIC METABOLIC PANEL
Anion gap: 7 (ref 5–15)
BUN: 9 mg/dL (ref 6–20)
CO2: 26 mmol/L (ref 22–32)
Calcium: 9.1 mg/dL (ref 8.9–10.3)
Chloride: 103 mmol/L (ref 98–111)
Creatinine, Ser: 0.47 mg/dL (ref 0.44–1.00)
GFR, Estimated: >60 mL/min (ref >60)
Glucose, Bld: 123 mg/dL — ABNORMAL HIGH (ref 70–99)
Potassium: 3.4 mmol/L — ABNORMAL LOW (ref 3.5–5.1)
Sodium: 136 mmol/L (ref 135–145)

## 2020-07-19 LAB — CK: Total CK: 121 U/L (ref 38–234)

## 2020-07-19 MED ORDER — SUCRALFATE 1 GM/10ML PO SUSP: 1.0000 g | Freq: Three times a day (TID) | ORAL | 0 refills | Status: DC

## 2020-07-19 MED ORDER — LIDOCAINE VISCOUS HCL 2 % MT SOLN
15.0000 mL | Freq: Once | OROMUCOSAL | Status: AC
  Administered 2020-07-19: 15 mL via ORAL
  Filled 2020-07-19: qty 15

## 2020-07-19 MED ORDER — LORAZEPAM 2 MG/ML IJ SOLN
1.0000 mg | Freq: Once | INTRAMUSCULAR | Status: AC
  Administered 2020-07-19: 1 mg via INTRAVENOUS
  Filled 2020-07-19: qty 1

## 2020-07-19 MED ORDER — SODIUM CHLORIDE 0.9 % IV BOLUS
1000.0000 mL | Freq: Once | INTRAVENOUS | Status: AC
  Administered 2020-07-19: 1000 mL via INTRAVENOUS

## 2020-07-19 MED ORDER — ALUM & MAG HYDROXIDE-SIMETH 200-200-20 MG/5ML PO SUSP
30.0000 mL | Freq: Once | ORAL | Status: AC
  Administered 2020-07-19: 30 mL via ORAL
  Filled 2020-07-19: qty 30

## 2020-07-19 MED ORDER — FAMOTIDINE 20 MG PO TABS: 20.0000 mg | ORAL_TABLET | Freq: Two times a day (BID) | ORAL | 0 refills | Status: DC

## 2020-07-19 NOTE — ED Provider Notes (Signed)
[facility] EMERGENCY DEPARTMENT Provider Note   CSN: [id number] Arrival date & time: 07/19/20  1558     History Chief Complaint  Patient presents with  . Muscle Pain    Paige Hamilton is a 40 y.o. female.  Patient with history of gastrectomy, cholecystectomy presents the emergency department today for evaluation of muscle cramps and vomiting.  Patient states that her symptoms began yesterday.  She has a history of restless leg syndrome but over the past 2 days she has felt restless in all of her muscles including her back and shoulders.  She notes that she is able to eat solid foods but whenever she tries to drink water, she gags and regurgitates the water.  She denies chest pain or shortness of breath.  She has a history of GERD and is on Protonix, but states that this feels different and GERD symptoms.  No constipation or diarrhea.  No treatments prior to arrival.  She denies antihistamine medications.        Past Medical History:  Diagnosis Date  . Chronic pain   . Edema leg   . Fibromyalgia   . GERD (gastroesophageal reflux disease)   . High risk HPV infection   . Hirsutism   . Hyperlipidemia   . Hypertension   . IFG (impaired fasting glucose)   . Menorrhagia   . PCOS (polycystic ovarian syndrome)   . Tuberculosis     Patient Active Problem List   Diagnosis Date Noted  . Other malaise and fatigue 01/16/2013  . Depression, acute 01/16/2013  . Nausea alone 05/04/2012  . Obesity, unspecified 05/04/2012    Past Surgical History:  Procedure Laterality Date  . ABDOMINAL SURGERY    . CHOLECYSTECTOMY    . GASTRECTOMY    . LAPAROSCOPIC GASTRIC SLEEVE RESECTION       OB History   No obstetric history on file.     Family History  Problem Relation Age of Onset  . Heart disease Mother        CVD  . Diabetes type II Mother   . Hypertension Mother   . Hyperlipidemia Mother   . Diabetes type II Father   . Hypertension Father   . Hyperlipidemia Father    . Diabetes type II Brother   . Hypertension Brother   . Asthma Other        Niece    Social History   Tobacco Use  . Smoking status: Never Smoker  . Smokeless tobacco: Never Used  Vaping Use  . Vaping Use: Never used  Substance Use Topics  . Alcohol use: No  . Drug use: No    Home Medications Prior to Admission medications   Medication Sig Start Date End Date Taking? Authorizing Provider  famotidine (PEPCID) 20 MG tablet Take 1 tablet (20 mg total) by mouth 2 (two) times daily. 07/19/20  Yes Renne Crigler, PA-C  sucralfate (CARAFATE) 1 GM/10ML suspension Take 10 mLs (1 g total) by mouth 4 (four) times daily -  with meals and at bedtime. 07/19/20  Yes Renne Crigler, PA-C  DULoxetine HCl (CYMBALTA PO) Take by mouth.    [provider]  Lisdexamfetamine Dimesylate (VYVANSE PO) Take by mouth.    [provider]  ROPINIRole HCl (REQUIP PO) Take by mouth.    [provider]  TIZANIDINE HCL PO Take by mouth.    [provider]  dicyclomine (BENTYL) 20 MG tablet Take 1 tablet (20 mg total) by mouth 2 (two) times daily.  07/22/18 07/19/20  Alvira Monday, MD  omeprazole (PRILOSEC) 20 MG capsule Take 1 capsule (20 mg total) by mouth daily. 03/14/17 07/19/20  Joy, Shawn C, PA-C  pantoprazole (PROTONIX) 20 MG tablet Take 1 tablet (20 mg total) by mouth 2 (two) times daily. 10/21/17 07/19/20  Mortis, Jerrel Ivory I, PA-C  prochlorperazine (COMPAZINE) 10 MG tablet Take 1 tablet (10 mg total) by mouth 2 (two) times daily as needed for nausea. 12/17/18 07/19/20  Sabas Sous, MD    Allergies    Ibuprofen and Naproxen  Review of Systems   Review of Systems  Constitutional: Negative for fever.  HENT: Negative for rhinorrhea and sore throat.   Eyes: Negative for redness.  Respiratory: Negative for cough.   Cardiovascular: Negative for chest pain.  Gastrointestinal: Positive for vomiting. Negative for abdominal pain, diarrhea and nausea.  Genitourinary: Negative for  dysuria, frequency, hematuria and urgency.  Musculoskeletal: Positive for myalgias.  Skin: Negative for rash.  Neurological: Negative for headaches.    Physical Exam Updated Vital Signs BP 110/68 (BP Location: Left Arm)   Pulse 94   Temp 98.3 F (36.8 C) (Oral)   Resp 20   Ht 5\' 9"  (1.753 m)   Wt 112.9 kg   LMP 07/05/2020 (Approximate)   SpO2 99%   BMI 36.77 kg/m   Physical Exam Vitals and nursing note reviewed.  Constitutional:      General: She is not in acute distress.    Appearance: She is well-developed.  HENT:     Head: Normocephalic and atraumatic.     Right Ear: External ear normal.     Left Ear: External ear normal.     Nose: Nose normal.  Eyes:     Conjunctiva/sclera: Conjunctivae normal.  Cardiovascular:     Rate and Rhythm: Normal rate and regular rhythm.     Heart sounds: No murmur heard.   Pulmonary:     Effort: No respiratory distress.     Breath sounds: No wheezing, rhonchi or rales.  Abdominal:     Palpations: Abdomen is soft.     Tenderness: There is no abdominal tenderness. There is no guarding or rebound.  Musculoskeletal:        General: No tenderness.     Cervical back: Normal range of motion and neck supple.     Right lower leg: No edema.     Left lower leg: No edema.     Comments: + restlessness of muscles  Skin:    General: Skin is warm and dry.     Findings: No rash.  Neurological:     General: No focal deficit present.     Mental Status: She is alert. Mental status is at baseline.     Motor: No weakness.  Psychiatric:        Mood and Affect: Mood normal.     ED Results / Procedures / Treatments   Labs (all labs ordered are listed, but only abnormal results are displayed) Labs Reviewed  BASIC METABOLIC PANEL - Abnormal; Notable for the following components:      Result Value   Potassium 3.4 (*)    Glucose, Bld 123 (*)    All other components within normal limits  CBC WITH DIFFERENTIAL/PLATELET  PREGNANCY, URINE   URINALYSIS, ROUTINE W REFLEX MICROSCOPIC  CK    EKG None  Radiology No results found.  Procedures Procedures   Medications Ordered in ED Medications  alum & mag hydroxide-simeth (MAALOX/MYLANTA) 200-200-20 MG/5ML suspension 30 mL (30 mLs  Oral Given 07/19/20 1737)    And  lidocaine (XYLOCAINE) 2 % viscous mouth solution 15 mL (15 mLs Oral Given 07/19/20 1737)  sodium chloride 0.9 % bolus 1,000 mL (1,000 mLs Intravenous New Bag/Given 07/19/20 1834)  LORazepam (ATIVAN) injection 1 mg (1 mg Intravenous Given 07/19/20 1834)    ED Course  I have reviewed the triage vital signs and the nursing notes.  Pertinent labs & imaging results that were available during my care of the patient were reviewed by me and considered in my medical decision making (see chart for details).  Patient seen and examined. Work-up initiated.   Vital signs reviewed and are as follows: BP 110/68 (BP Location: Left Arm)   Pulse 94   Temp 98.3 F (36.8 C) (Oral)   Resp 20   Ht 5\' 9"  (1.753 m)   Wt 112.9 kg   LMP 07/05/2020 (Approximate)   SpO2 99%   BMI 36.77 kg/m   6:44 PM labs are reassuring.  Patient updated.  She continues to feel very poorly.  She has been able to tolerate a few sips of liquid, but is more anxious and afraid to take steps.  We will give IV fluid bolus and IV Ativan.  Patient does have a ride home.  8:06 PM UA neg. patient feeling much better after IV fluids and Ativan.  She is comfortable with discharged home at this time.  We will add short course of Carafate and Pepcid for her swallowing issues.  Encouraged rest, good hydration with Jell-O or applesauce if she cannot tolerate just fluids.  Encourage PCP follow-up for recheck.    MDM Rules/Calculators/A&P                          N/V: Labs are reassuring.  This only occurs with liquids.  It does not really seem like dysphagia and tolerating some sips of fluid here, GI cocktail.  Myalgias/restless legs: CK is normal.  Improved here  with Ativan.  Continue Requip.  PCP follow-up as needed.   Final Clinical Impression(s) / ED Diagnoses Final diagnoses:  Myalgia  Non-intractable vomiting with nausea, unspecified vomiting type    Rx / DC Orders ED Discharge Orders    None       07/07/2020, PA-C 07/19/20 2008    Floyd, Dan, DO 07/19/20 2145

## 2020-07-19 NOTE — ED Notes (Signed)
Pt. Wants to delay discharge until her brother arrives at 8:40pm.

## 2020-07-19 NOTE — ED Triage Notes (Addendum)
Pt c/o "muscle cramps-vomit every time I drink water but am able to eat"-sx started yesterday-also c/o restless leg syndrome-NAD-steady gait

## 2020-07-19 NOTE — ED Notes (Signed)
Pt provided with water, instructed to take small sips intermittently when her nausea improves. States she has been unable to keep anything down.

## 2020-07-19 NOTE — Discharge Instructions (Signed)
Please read and follow all provided instructions.  Your diagnoses today include:  1. Myalgia   2. Non-intractable vomiting with nausea, unspecified vomiting type     Tests performed today include: Blood cell counts (white, red, and platelets) Electrolytes  Kidney function test Urine test to check for infection  Vital signs. See below for your results today.   Medications prescribed:   Carafate - for stomach upset and to protect your stomach   Pepcid (famotidine) - antihistamine  You can find this medication over-the-counter.   DO NOT exceed:   20mg  Pepcid every 12 hours  Take any prescribed medications only as directed.  Home care instructions:  Follow any educational materials contained in this packet.  BE VERY CAREFUL not to take multiple medicines containing Tylenol (also called acetaminophen). Doing so can lead to an overdose which can damage your liver and cause liver failure and possibly death.   Follow-up instructions: Please follow-up with your primary care provider in the next 3 days for further evaluation of your symptoms.   Return instructions:   Please return to the Emergency Department if you experience worsening symptoms.   Please return if you have any other emergent concerns.  Additional Information:  Your vital signs today were: BP 121/68   Pulse 87   Temp 98.3 F (36.8 C) (Oral)   Resp 18   Ht 5\' 9"  (1.753 m)   Wt 112.9 kg   LMP 07/05/2020 (Approximate)   SpO2 100%   BMI 36.77 kg/m  If your blood pressure (BP) was elevated above 135/85 this visit, please have this repeated by your doctor within one month. --------------

## 2020-10-13 ENCOUNTER — Other Ambulatory Visit: Payer: Self-pay

## 2020-10-13 ENCOUNTER — Encounter (HOSPITAL_BASED_OUTPATIENT_CLINIC_OR_DEPARTMENT_OTHER): Payer: Self-pay

## 2020-10-13 ENCOUNTER — Emergency Department (HOSPITAL_BASED_OUTPATIENT_CLINIC_OR_DEPARTMENT_OTHER)
Admission: EM | Admit: 2020-10-13 | Discharge: 2020-10-13 | Disposition: A | Discharge status: Home / Self Care | Payer: PRIVATE HEALTH INSURANCE | Attending: Emergency Medicine | Admitting: Emergency Medicine

## 2020-10-13 ENCOUNTER — Emergency Department (HOSPITAL_BASED_OUTPATIENT_CLINIC_OR_DEPARTMENT_OTHER): Payer: PRIVATE HEALTH INSURANCE

## 2020-10-13 DIAGNOSIS — Z79899 Other long term (current) drug therapy: Secondary | ICD-10-CM | POA: Insufficient documentation

## 2020-10-13 DIAGNOSIS — N9489 Other specified conditions associated with female genital organs and menstrual cycle: Secondary | ICD-10-CM | POA: Diagnosis not present

## 2020-10-13 DIAGNOSIS — J029 Acute pharyngitis, unspecified: Secondary | ICD-10-CM

## 2020-10-13 DIAGNOSIS — I10 Essential (primary) hypertension: Secondary | ICD-10-CM | POA: Diagnosis not present

## 2020-10-13 DIAGNOSIS — K219 Gastro-esophageal reflux disease without esophagitis: Secondary | ICD-10-CM | POA: Diagnosis not present

## 2020-10-13 DIAGNOSIS — J069 Acute upper respiratory infection, unspecified: Secondary | ICD-10-CM | POA: Insufficient documentation

## 2020-10-13 DIAGNOSIS — Z20822 Contact with and (suspected) exposure to covid-19: Secondary | ICD-10-CM | POA: Insufficient documentation

## 2020-10-13 DIAGNOSIS — R059 Cough, unspecified: Secondary | ICD-10-CM

## 2020-10-13 LAB — CBC WITH DIFFERENTIAL/PLATELET
Abs Immature Granulocytes: 0.01 10*3/uL (ref 0.00–0.07)
Basophils Absolute: 0 10*3/uL (ref 0.0–0.1)
Basophils Relative: 1 %
Eosinophils Absolute: 0.3 10*3/uL (ref 0.0–0.5)
Eosinophils Relative: 5 %
HCT: 39.2 % (ref 36.0–46.0)
Hemoglobin: 13.4 g/dL (ref 12.0–15.0)
Immature Granulocytes: 0 %
Lymphocytes Relative: 37 %
Lymphs Abs: 2.3 10*3/uL (ref 0.7–4.0)
MCH: 29.1 pg (ref 26.0–34.0)
MCHC: 34.2 g/dL (ref 30.0–36.0)
MCV: 85 fL (ref 80.0–100.0)
Monocytes Absolute: 0.5 10*3/uL (ref 0.1–1.0)
Monocytes Relative: 8 %
Neutro Abs: 3.1 10*3/uL (ref 1.7–7.7)
Neutrophils Relative %: 49 %
Platelets: 301 10*3/uL (ref 150–400)
RBC: 4.61 MIL/uL (ref 3.87–5.11)
RDW: 13.3 % (ref 11.5–15.5)
WBC: 6.3 10*3/uL (ref 4.0–10.5)
nRBC: 0 % (ref 0.0–0.2)

## 2020-10-13 LAB — URINALYSIS, ROUTINE W REFLEX MICROSCOPIC
Glucose, UA: NEGATIVE mg/dL
Ketones, ur: 15 mg/dL — AB
Nitrite: NEGATIVE
Protein, ur: 30 mg/dL — AB
Specific Gravity, Urine: 1.03 (ref 1.005–1.030)
pH: 5 (ref 5.0–8.0)

## 2020-10-13 LAB — COMPREHENSIVE METABOLIC PANEL
ALT: 19 U/L (ref 0–44)
AST: 15 U/L (ref 15–41)
Albumin: 4 g/dL (ref 3.5–5.0)
Alkaline Phosphatase: 67 U/L (ref 38–126)
Anion gap: 8 (ref 5–15)
BUN: 17 mg/dL (ref 6–20)
CO2: 26 mmol/L (ref 22–32)
Calcium: 8.9 mg/dL (ref 8.9–10.3)
Chloride: 105 mmol/L (ref 98–111)
Creatinine, Ser: 0.52 mg/dL (ref 0.44–1.00)
GFR, Estimated: >60 mL/min (ref >60)
Glucose, Bld: 132 mg/dL — ABNORMAL HIGH (ref 70–99)
Potassium: 3.2 mmol/L — ABNORMAL LOW (ref 3.5–5.1)
Sodium: 139 mmol/L (ref 135–145)
Total Bilirubin: 0.3 mg/dL (ref 0.3–1.2)
Total Protein: 7 g/dL (ref 6.5–8.1)

## 2020-10-13 LAB — D-DIMER, QUANTITATIVE: D-Dimer, Quant: 0.34 ug/mL-FEU (ref 0.00–0.50)

## 2020-10-13 LAB — URINALYSIS, MICROSCOPIC (REFLEX)

## 2020-10-13 LAB — RESP PANEL BY RT-PCR (FLU A&B, COVID) ARPGX2
Influenza A by PCR: NEGATIVE
Influenza B by PCR: NEGATIVE
SARS Coronavirus 2 by RT PCR: NEGATIVE

## 2020-10-13 LAB — HCG, SERUM, QUALITATIVE: Preg, Serum: NEGATIVE

## 2020-10-13 LAB — LIPASE, BLOOD: Lipase: 41 U/L (ref 11–51)

## 2020-10-13 MED ORDER — IPRATROPIUM-ALBUTEROL 0.5-2.5 (3) MG/3ML IN SOLN
3.0000 mL | Freq: Once | RESPIRATORY_TRACT | Status: AC
  Administered 2020-10-13: 3 mL via RESPIRATORY_TRACT
  Filled 2020-10-13: qty 3

## 2020-10-13 MED ORDER — BENZONATATE 200 MG PO CAPS
200.0000 mg | ORAL_CAPSULE | Freq: Three times a day (TID) | ORAL | 0 refills | Status: DC | PRN
Start: 2020-10-13 — End: 2021-02-04

## 2020-10-13 MED ORDER — HYDROCODONE BIT-HOMATROP MBR 5-1.5 MG/5ML PO SOLN: 5.0000 mL | Freq: Four times a day (QID) | ORAL | 0 refills | Status: DC | PRN

## 2020-10-13 MED ORDER — AEROCHAMBER PLUS FLO-VU MISC: 2 refills | Status: AC

## 2020-10-13 MED ORDER — FAMOTIDINE IN NACL 20-0.9 MG/50ML-% IV SOLN
20.0000 mg | Freq: Once | INTRAVENOUS | Status: AC
  Administered 2020-10-13: 20 mg via INTRAVENOUS
  Filled 2020-10-13: qty 50

## 2020-10-13 MED ORDER — ALBUTEROL SULFATE HFA 108 (90 BASE) MCG/ACT IN AERS: 2.0000 | INHALATION_SPRAY | RESPIRATORY_TRACT | 0 refills | Status: DC | PRN

## 2020-10-13 MED ORDER — FAMOTIDINE 20 MG PO TABS: 20.0000 mg | ORAL_TABLET | Freq: Two times a day (BID) | ORAL | 0 refills | Status: DC

## 2020-10-13 MED ORDER — BENZONATATE 100 MG PO CAPS
200.0000 mg | ORAL_CAPSULE | Freq: Once | ORAL | Status: AC
  Administered 2020-10-13: 200 mg via ORAL
  Filled 2020-10-13: qty 2

## 2020-10-13 NOTE — ED Triage Notes (Signed)
Hysteroscopy D and C on 8/26, states sore throat, cough,and upper abd pain since.  Epigastric pain.  Seen 8/29, told sore throat from Intubation.  Taking antibiotics (Flagyl and Doxycycline)  Currently started on 8/29.

## 2020-10-13 NOTE — ED Provider Notes (Signed)
[facility] EMERGENCY DEPARTMENT Provider Note   CSN: [id number] Arrival date & time: 10/13/20  1530     History Chief Complaint  Patient presents with   Abdominal Pain   Cough   Sore Throat    Paige Hamilton is a 40 y.o. female.  HPI Patient reports he had a D&C 8\26 for excessive vaginal bleeding.  Patient reports after the procedure she started having problems with sore throat and upper abdominal pain.  She had a follow-up appointment with her physician [phone].  At that time it was felt that the throat pain was due to intubation.  Patient reports that since then she is also developed quite a bit of coughing.  She reports she has central chest pain and has been very nauseated as well.  Patient's physician started her on doxycycline and Flagyl empirically.  She was given Zofran 4 mg to take every 4 hours.  She has ibuprofen for pain.  She reports symptoms are worsening and she is feeling more tightness in her throat, central chest pain and left upper chest pain, increasing shortness of breath.  She reports her resting heart rate at home has been up in the 140s at times.  He reports she is concerned possibly about COVID as well.  She has taken a negative home test but would like a test again.  She has been vaccinated.    Past Medical History:  Diagnosis Date   Chronic pain    Edema leg    Fibromyalgia    GERD (gastroesophageal reflux disease)    High risk HPV infection    Hirsutism    Hyperlipidemia    Hypertension    IFG (impaired fasting glucose)    Menorrhagia    PCOS (polycystic ovarian syndrome)    Tuberculosis     Patient Active Problem List   Diagnosis Date Noted   Other malaise and fatigue 01/16/2013   Depression, acute 01/16/2013   Nausea alone 05/04/2012   Obesity, unspecified 05/04/2012    Past Surgical History:  Procedure Laterality Date   ABDOMINAL SURGERY     CHOLECYSTECTOMY     GASTRECTOMY     LAPAROSCOPIC GASTRIC SLEEVE RESECTION       OB  History   No obstetric history on file.     Family History  Problem Relation Age of Onset   Heart disease Mother        CVD   Diabetes type II Mother    Hypertension Mother    Hyperlipidemia Mother    Diabetes type II Father    Hypertension Father    Hyperlipidemia Father    Diabetes type II Brother    Hypertension Brother    Asthma Other        Niece    Social History   Tobacco Use   Smoking status: Never   Smokeless tobacco: Never  Vaping Use   Vaping Use: Never used  Substance Use Topics   Alcohol use: No   Drug use: No    Home Medications Prior to Admission medications   Medication Sig Start Date End Date Taking? Authorizing Provider  albuterol (VENTOLIN HFA) 108 (90 Base) MCG/ACT inhaler Inhale 2 puffs into the lungs every 4 (four) hours as needed for wheezing or shortness of breath. 10/13/20  Yes Arby Barrette, MD  benzonatate (TESSALON) 200 MG capsule Take 1 capsule (200 mg total) by mouth 3 (three) times daily as needed for cough. 10/13/20  Yes Arby Barrette, MD  famotidine (PEPCID) 20  MG tablet Take 1 tablet (20 mg total) by mouth 2 (two) times daily. 10/13/20  Yes Arby Barrette, MD  HYDROcodone bit-homatropine (HYCODAN) 5-1.5 MG/5ML syrup Take 5 mLs by mouth every 6 (six) hours as needed for cough. 10/13/20  Yes Arby Barrette, MD  Spacer/Aero-Holding Chambers (AEROCHAMBER PLUS WITH MASK) inhaler Use as instructed 10/13/20  Yes Arby Barrette, MD  DULoxetine HCl (CYMBALTA PO) Take by mouth.    [provider]  famotidine (PEPCID) 20 MG tablet Take 1 tablet (20 mg total) by mouth 2 (two) times daily. 07/19/20   Renne Crigler, PA-C  Lisdexamfetamine Dimesylate (VYVANSE PO) Take by mouth.    [provider]  ROPINIRole HCl (REQUIP PO) Take by mouth.    [provider]  sucralfate (CARAFATE) 1 GM/10ML suspension Take 10 mLs (1 g total) by mouth 4 (four) times daily -  with meals and at bedtime. 07/19/20   Renne Crigler, PA-C  TIZANIDINE HCL  PO Take by mouth.    [provider]  dicyclomine (BENTYL) 20 MG tablet Take 1 tablet (20 mg total) by mouth 2 (two) times daily. 07/22/18 07/19/20  Alvira Monday, MD  omeprazole (PRILOSEC) 20 MG capsule Take 1 capsule (20 mg total) by mouth daily. 03/14/17 07/19/20  Joy, Shawn C, PA-C  pantoprazole (PROTONIX) 20 MG tablet Take 1 tablet (20 mg total) by mouth 2 (two) times daily. 10/21/17 07/19/20  Mortis, Jerrel Ivory I, PA-C  prochlorperazine (COMPAZINE) 10 MG tablet Take 1 tablet (10 mg total) by mouth 2 (two) times daily as needed for nausea. 12/17/18 07/19/20  Sabas Sous, MD    Allergies    Ibuprofen and Naproxen  Review of Systems   Review of Systems 10 systems reviewed and negative except as per HPI Physical Exam Updated Vital Signs BP (!) 120/56 (BP Location: Left Arm)   Pulse 92   Temp 98.2 F (36.8 C) (Oral)   Resp 16   Ht 5\' 9"  (1.753 m)   Wt 108 kg   LMP 10/09/2020   SpO2 100%   BMI 35.15 kg/m   Physical Exam Constitutional:      Comments: Alert, nontoxic.  Paroxysmal cough.  HENT:     Head: Normocephalic and atraumatic.     Mouth/Throat:     Mouth: Mucous membranes are moist.     Pharynx: Oropharynx is clear.  Eyes:     Conjunctiva/sclera: Conjunctivae normal.  Neck:     Comments: Neck supple.  No stridor. Cardiovascular:     Comments: Borderline tachycardia.  No gross rub murmur gallop. Pulmonary:     Effort: Pulmonary effort is normal.     Breath sounds: Normal breath sounds.  Abdominal:     General: There is no distension.     Palpations: Abdomen is soft.     Tenderness: There is abdominal tenderness. There is no guarding.     Comments: Moderate epigastric tenderness to palpation.  Lower abdomen nontender.  Musculoskeletal:        General: No swelling or tenderness. Normal range of motion.     Right lower leg: No edema.     Left lower leg: No edema.  Skin:    General: Skin is warm and dry.  Neurological:     General: No focal deficit present.      Mental Status: She is oriented to person, place, and time.     Coordination: Coordination normal.  Psychiatric:        Mood and Affect: Mood normal.  ED Results / Procedures / Treatments   Labs (all labs ordered are listed, but only abnormal results are displayed) Labs Reviewed  COMPREHENSIVE METABOLIC PANEL - Abnormal; Notable for the following components:      Result Value   Potassium 3.2 (*)    Glucose, Bld 132 (*)    All other components within normal limits  URINALYSIS, ROUTINE W REFLEX MICROSCOPIC - Abnormal; Notable for the following components:   APPearance CLOUDY (*)    Hgb urine dipstick LARGE (*)    Bilirubin Urine SMALL (*)    Ketones, ur 15 (*)    Protein, ur 30 (*)    Leukocytes,Ua TRACE (*)    All other components within normal limits  URINALYSIS, MICROSCOPIC (REFLEX) - Abnormal; Notable for the following components:   Bacteria, UA FEW (*)    All other components within normal limits  RESP PANEL BY RT-PCR (FLU A&B, COVID) ARPGX2  LIPASE, BLOOD  CBC WITH DIFFERENTIAL/PLATELET  D-DIMER, QUANTITATIVE  HCG, SERUM, QUALITATIVE    EKG None  Radiology DG Chest Port 1 View  Result Date: 10/13/2020 CLINICAL DATA:  Sore throat, cough, upper abdominal pain EXAM: PORTABLE CHEST 1 VIEW COMPARISON:  Radiograph 09/01/2019 FINDINGS: The heart size and mediastinal contours are within normal limits.No focal airspace disease. No pleural effusion or pneumothorax.No acute osseous abnormality. IMPRESSION: No evidence of acute cardiopulmonary disease. Electronically Signed   By: Caprice Renshaw M.D.   On: 10/13/2020 17:23    Procedures Procedures   Medications Ordered in ED Medications  ipratropium-albuterol (DUONEB) 0.5-2.5 (3) MG/3ML nebulizer solution 3 mL (3 mLs Nebulization Given 10/13/20 1658)  benzonatate (TESSALON) capsule 200 mg (200 mg Oral Given 10/13/20 1709)  famotidine (PEPCID) IVPB 20 mg premix (20 mg Intravenous New Bag/Given 10/13/20 1713)    ED Course  I have  reviewed the triage vital signs and the nursing notes.  Pertinent labs & imaging results that were available during my care of the patient were reviewed by me and considered in my medical decision making (see chart for details).    MDM Rules/Calculators/A&P                          Patient presents as outlined with several symptoms.  She has recently had a D&C with intubation.  At this time there did not appear to be acute complications.  She has had persistent sore throat.  Patient does not have any stridor and visual inspection of the throat is normal.  COVID is negative.  Chest x-ray normal.  Patient is nontoxic.  D-dimer within normal limits making PE low probability.  At this time we will proceed with symptomatic treatment.  Patient describes a lot of nausea and coughing.  Will add Pepcid for suspected reflux.  Will add albuterol inhaler for bronchospasm and Tessalon Perles and Hycodan for cough.  Patient is recommended for close follow-up with PCP and return precautions reviewed.  Final Clinical Impression(s) / ED Diagnoses Final diagnoses:  Cough  Pharyngitis, unspecified etiology  Viral upper respiratory tract infection  Gastroesophageal reflux disease, unspecified whether esophagitis present    Rx / DC Orders ED Discharge Orders          Ordered    benzonatate (TESSALON) 200 MG capsule  3 times daily PRN        10/13/20 1853    albuterol (VENTOLIN HFA) 108 (90 Base) MCG/ACT inhaler  Every 4 hours PRN        10/13/20  1853    Spacer/Aero-Holding Chambers (AEROCHAMBER PLUS WITH MASK) inhaler        10/13/20 1853    HYDROcodone bit-homatropine (HYCODAN) 5-1.5 MG/5ML syrup  Every 6 hours PRN        10/13/20 1853    famotidine (PEPCID) 20 MG tablet  2 times daily        10/13/20 1854             Arby BarrettePfeiffer, Jacquis Paxton, MD 10/13/20 1859

## 2020-10-13 NOTE — Discharge Instructions (Addendum)
1.  Sometimes coughing and burning in the throat can be caused by gastroesophageal reflux disease.  Otherwise known as GERD.  Please review instructions regarding diet and management of this at home.  Take Pepcid twice a day as prescribed for the next 2 weeks to see if your symptoms are improved.  You may also continue the Zofran as prescribed by your physician for any nausea or vomiting. 2.  To help with symptoms of cough, you have been given several medications.  Use the prescribed inhaler 2 puffs every 4-6 hours for the next 2 days then if needed.  You may also take the Tessalon Perle as prescribed.  Do not chew or suck on this.  This is meant to be swallowed whole.  If you are still having a significant amount of cough you may take the Hycodan syrup.  This may make you drowsy so you cannot drive or work while using it. 3.  Schedule a follow-up appointment with your doctor for recheck as soon as possible.  Return to the emergency department if you get new worsening or concerning symptoms.

## 2021-01-06 ENCOUNTER — Emergency Department (HOSPITAL_BASED_OUTPATIENT_CLINIC_OR_DEPARTMENT_OTHER): Payer: BLUE CROSS/BLUE SHIELD

## 2021-01-06 ENCOUNTER — Emergency Department (HOSPITAL_BASED_OUTPATIENT_CLINIC_OR_DEPARTMENT_OTHER)
Admission: EM | Admit: 2021-01-06 | Discharge: 2021-01-06 | Disposition: A | Discharge status: Home / Self Care | Payer: BLUE CROSS/BLUE SHIELD | Attending: Emergency Medicine | Admitting: Emergency Medicine

## 2021-01-06 ENCOUNTER — Other Ambulatory Visit: Payer: Self-pay

## 2021-01-06 DIAGNOSIS — Z4801 Encounter for change or removal of surgical wound dressing: Secondary | ICD-10-CM | POA: Insufficient documentation

## 2021-01-06 DIAGNOSIS — M79662 Pain in left lower leg: Secondary | ICD-10-CM | POA: Insufficient documentation

## 2021-01-06 DIAGNOSIS — I10 Essential (primary) hypertension: Secondary | ICD-10-CM | POA: Insufficient documentation

## 2021-01-06 DIAGNOSIS — Z79899 Other long term (current) drug therapy: Secondary | ICD-10-CM | POA: Diagnosis not present

## 2021-01-06 DIAGNOSIS — Z5189 Encounter for other specified aftercare: Secondary | ICD-10-CM

## 2021-01-06 MED ORDER — KETOROLAC TROMETHAMINE 15 MG/ML IJ SOLN
15.0000 mg | Freq: Once | INTRAMUSCULAR | Status: AC
  Administered 2021-01-06: 15 mg via INTRAMUSCULAR
  Filled 2021-01-06: qty 1

## 2021-01-06 NOTE — ED Triage Notes (Addendum)
?  spider bite to left leg 2 weeks ago. States leg is swollen and "feels heavy". Small dark circular wound noted to left calf without redness. States she returned on 11/22 from [place]

## 2021-01-06 NOTE — ED Provider Notes (Signed)
[facility] EMERGENCY DEPARTMENT Provider Note   CSN: [id number] Arrival date & time: 01/06/21  1504     History Chief Complaint  Patient presents with   Wound Check    Paige Hamilton is a 40 y.o. female.  40 y.o female with a PMH of fibromyalgia, GERD, hypertension, PCOS presents to the ED with a chief complaint of left leg pain for the past 2 weeks.  Patient reports she was "hit by a spider ".  Episode was not witnessed, she reports a small dark circular wound noted to her left calf.  Reports pain has been worsening, she also feels that her leg is now heavy along with some worsening pain along her shin.  Reports after she was bit she was running fevers of 102 at home.  Was treating this with Tylenol.  States she is concerned as she recently returned from [place], had a travel time of 21 hours.  He has not been applying any medication to her wound.  She is without any chest pain, no shortness of breath, no fevers.  The history is provided by medical records and the patient.  Wound Check This is a new problem. The current episode started more than 1 week ago. The problem occurs constantly. The problem has been gradually improving. Pertinent negatives include no chest pain, no abdominal pain, no headaches and no shortness of breath. Nothing aggravates the symptoms.      Past Medical History:  Diagnosis Date   Chronic pain    Edema leg    Fibromyalgia    GERD (gastroesophageal reflux disease)    High risk HPV infection    Hirsutism    Hyperlipidemia    Hypertension    IFG (impaired fasting glucose)    Menorrhagia    PCOS (polycystic ovarian syndrome)    Tuberculosis     Patient Active Problem List   Diagnosis Date Noted   Other malaise and fatigue 01/16/2013   Depression, acute 01/16/2013   Nausea alone 05/04/2012   Obesity, unspecified 05/04/2012    Past Surgical History:  Procedure Laterality Date   ABDOMINAL SURGERY     CHOLECYSTECTOMY     GASTRECTOMY      LAPAROSCOPIC GASTRIC SLEEVE RESECTION       OB History   No obstetric history on file.     Family History  Problem Relation Age of Onset   Heart disease Mother        CVD   Diabetes type II Mother    Hypertension Mother    Hyperlipidemia Mother    Diabetes type II Father    Hypertension Father    Hyperlipidemia Father    Diabetes type II Brother    Hypertension Brother    Asthma Other        Niece    Social History   Tobacco Use   Smoking status: Never   Smokeless tobacco: Never  Vaping Use   Vaping Use: Never used  Substance Use Topics   Alcohol use: No   Drug use: No    Home Medications Prior to Admission medications   Medication Sig Start Date End Date Taking? Authorizing Provider  albuterol (VENTOLIN HFA) 108 (90 Base) MCG/ACT inhaler Inhale 2 puffs into the lungs every 4 (four) hours as needed for wheezing or shortness of breath. 10/13/20   Arby Barrette, MD  benzonatate (TESSALON) 200 MG capsule Take 1 capsule (200 mg total) by mouth 3 (three) times daily as needed for cough. 10/13/20  Arby Barrette, MD  DULoxetine HCl (CYMBALTA PO) Take by mouth.    [provider]  famotidine (PEPCID) 20 MG tablet Take 1 tablet (20 mg total) by mouth 2 (two) times daily. 07/19/20   Renne Crigler, PA-C  famotidine (PEPCID) 20 MG tablet Take 1 tablet (20 mg total) by mouth 2 (two) times daily. 10/13/20   Arby Barrette, MD  HYDROcodone bit-homatropine (HYCODAN) 5-1.5 MG/5ML syrup Take 5 mLs by mouth every 6 (six) hours as needed for cough. 10/13/20   Arby Barrette, MD  Lisdexamfetamine Dimesylate (VYVANSE PO) Take by mouth.    [provider]  ROPINIRole HCl (REQUIP PO) Take by mouth.    [provider]  Spacer/Aero-Holding Chambers (AEROCHAMBER PLUS WITH MASK) inhaler Use as instructed 10/13/20   Arby Barrette, MD  sucralfate (CARAFATE) 1 GM/10ML suspension Take 10 mLs (1 g total) by mouth 4 (four) times daily -  with meals and at bedtime. 07/19/20    Renne Crigler, PA-C  TIZANIDINE HCL PO Take by mouth.    [provider]  dicyclomine (BENTYL) 20 MG tablet Take 1 tablet (20 mg total) by mouth 2 (two) times daily. 07/22/18 07/19/20  Alvira Monday, MD  omeprazole (PRILOSEC) 20 MG capsule Take 1 capsule (20 mg total) by mouth daily. 03/14/17 07/19/20  Joy, Shawn C, PA-C  pantoprazole (PROTONIX) 20 MG tablet Take 1 tablet (20 mg total) by mouth 2 (two) times daily. 10/21/17 07/19/20  Mortis, Jerrel Ivory I, PA-C  prochlorperazine (COMPAZINE) 10 MG tablet Take 1 tablet (10 mg total) by mouth 2 (two) times daily as needed for nausea. 12/17/18 07/19/20  Sabas Sous, MD    Allergies    Ibuprofen and Naproxen  Review of Systems   Review of Systems  Constitutional:  Negative for chills and fever.  Respiratory:  Negative for shortness of breath.   Cardiovascular:  Negative for chest pain.  Gastrointestinal:  Negative for abdominal pain, nausea and vomiting.  Skin:  Positive for wound.  Neurological:  Negative for headaches.   Physical Exam Updated Vital Signs BP (!) 141/68 (BP Location: Right Arm)   Pulse 86   Temp 98.3 F (36.8 C) (Oral)   Resp 18   Ht 5\' 8"  (1.727 m)   Wt 104.3 kg   LMP 12/16/2020   SpO2 100%   BMI 34.97 kg/m   Physical Exam Vitals and nursing note reviewed.  Constitutional:      Appearance: Normal appearance.  HENT:     Head: Normocephalic and atraumatic.  Cardiovascular:     Rate and Rhythm: Normal rate.     Pulses: Normal pulses.          Dorsalis pedis pulses are 2+ on the right side and 2+ on the left side.       Posterior tibial pulses are 2+ on the right side and 2+ on the left side.     Comments: Biliary refill is intact, sensation is intact throughout, no pitting edema.  No calf tenderness. Pulmonary:     Effort: Pulmonary effort is normal.  Abdominal:     General: Abdomen is flat.  Musculoskeletal:     Cervical back: Normal range of motion and neck supple.  Skin:    General: Skin is warm and  dry.     Findings: Wound present. No abscess, bruising, erythema or rash.       Neurological:     Mental Status: She is alert and oriented to person, place, and time.    ED  Results / Procedures / Treatments   Labs (all labs ordered are listed, but only abnormal results are displayed) Labs Reviewed - No data to display  EKG None  Radiology US Venous Img Lower Unilateral Left  Result Date: 01/06/2021 CLINICAL DATA:  Pain and swelling left lower extremity EXAM: Left LOWER EXTREMITY VENOUS DOPPLER ULTRASOUND TECHNIQUE: Gray-scale sonography with compression, as well as color and duplex ultrasound, were performed to evaluate the deep venous system(s) from the level of the common femoral vein through the popliteal and proximal calf veins. COMPARISON:  None. FINDINGS: VENOUS Normal compressibility of the common femoral, superficial femoral, and popliteal veins, as well as the visualized calf veins. Visualized portions of profunda femoral vein and great saphenous vein unremarkable. No filling defects to suggest DVT on grayscale or color Doppler imaging. Doppler waveforms show normal direction of venous flow, normal respiratory plasticity and response to augmentation. Limited views of the contralateral common femoral vein are unremarkable. OTHER Technologist examined the area of patient's symptom of spider bite in the left calf. There is no definite demonstrable loculated fluid collection. There is acoustic shadowing posterior to the region of the spider bite. Limitations: none IMPRESSION: There is no evidence of deep venous thrombosis in the left lower extremity. Electronically Signed   By: Ernie Avena M.D.   On: 01/06/2021 16:45    Procedures Procedures   Medications Ordered in ED Medications  ketorolac (TORADOL) 15 MG/ML injection 15 mg (has no administration in time range)    ED Course  I have reviewed the triage vital signs and the nursing notes.  Pertinent labs & imaging results  that were available during my care of the patient were reviewed by me and considered in my medical decision making (see chart for details).    MDM Rules/Calculators/A&P   Patient presents to the ED with a chief complaint of left leg pain that is been ongoing for the past 2 weeks after a unwitnessed spider bite.  She reports there was a wound present which has now developed a dark spot.  During evaluation her vitals are within normal limits, the wound appears without any erythema, no drainage, no streaking in the skin to suggest cellulitis.  She does endorse a fever that occurred when she first got bit however she arrived afebrile in the ED and with stable vital signs.  There is pain along the left shin, she does have a prior history of restless leg syndrome.  She is also concerned as she recently flew back from [place] with a 21-hour transit, her ultrasound on today's visit is negative for any DVT.  Ultrasound of the left lower extremity is without any acute findings.   Results were discussed with patient.  She was also given Haldol IM on today's visit, she does have listed a prior adverse reaction of swelling with NSAIDs, however she reports she does take NSAIDs while at home and this occur several years ago.  Her vitals are within normal limits.  I do not suspect any fracture, without any trauma.  No signs of infection, no blood.  Patient is stable for discharge at this time.   Portions of this note were generated with Scientist, clinical (histocompatibility and immunogenetics). Dictation errors may occur despite best attempts at proofreading.  Final Clinical Impression(s) / ED Diagnoses Final diagnoses:  Visit for wound check    Rx / DC Orders ED Discharge Orders     None        Claude Manges, PA-C 01/06/21 1759  Gwyneth Sprout, MD 01/11/21 1326

## 2021-01-06 NOTE — Discharge Instructions (Addendum)
The ultrasound of your left leg did not show any acute blood clot.  Received an anti-inflammatory on today's visit.  Please do not use any more ibuprofen, Aleve, Advil in the next 24 hours.  You may elevate your leg, apply some ice to the area to help with swelling.

## 2021-02-03 ENCOUNTER — Other Ambulatory Visit: Payer: Self-pay

## 2021-02-03 ENCOUNTER — Observation Stay (HOSPITAL_BASED_OUTPATIENT_CLINIC_OR_DEPARTMENT_OTHER)
Admission: EM | Admit: 2021-02-03 | Discharge: 2021-02-04 | Disposition: A | Discharge status: Home / Self Care | Payer: BLUE CROSS/BLUE SHIELD | Attending: Internal Medicine | Admitting: Internal Medicine

## 2021-02-03 ENCOUNTER — Emergency Department (HOSPITAL_BASED_OUTPATIENT_CLINIC_OR_DEPARTMENT_OTHER): Payer: BLUE CROSS/BLUE SHIELD

## 2021-02-03 ENCOUNTER — Encounter (HOSPITAL_BASED_OUTPATIENT_CLINIC_OR_DEPARTMENT_OTHER): Payer: Self-pay | Admitting: Emergency Medicine

## 2021-02-03 DIAGNOSIS — R7303 Prediabetes: Secondary | ICD-10-CM | POA: Diagnosis not present

## 2021-02-03 DIAGNOSIS — R0602 Shortness of breath: Secondary | ICD-10-CM | POA: Diagnosis not present

## 2021-02-03 DIAGNOSIS — U071 COVID-19: Secondary | ICD-10-CM | POA: Diagnosis not present

## 2021-02-03 DIAGNOSIS — R509 Fever, unspecified: Secondary | ICD-10-CM | POA: Diagnosis present

## 2021-02-03 DIAGNOSIS — J111 Influenza due to unidentified influenza virus with other respiratory manifestations: Secondary | ICD-10-CM

## 2021-02-03 DIAGNOSIS — R002 Palpitations: Secondary | ICD-10-CM

## 2021-02-03 DIAGNOSIS — E876 Hypokalemia: Secondary | ICD-10-CM | POA: Diagnosis not present

## 2021-02-03 DIAGNOSIS — Z79899 Other long term (current) drug therapy: Secondary | ICD-10-CM | POA: Insufficient documentation

## 2021-02-03 DIAGNOSIS — A419 Sepsis, unspecified organism: Secondary | ICD-10-CM

## 2021-02-03 DIAGNOSIS — J101 Influenza due to other identified influenza virus with other respiratory manifestations: Secondary | ICD-10-CM

## 2021-02-03 DIAGNOSIS — M797 Fibromyalgia: Secondary | ICD-10-CM | POA: Diagnosis not present

## 2021-02-03 DIAGNOSIS — N179 Acute kidney failure, unspecified: Secondary | ICD-10-CM

## 2021-02-03 DIAGNOSIS — K219 Gastro-esophageal reflux disease without esophagitis: Secondary | ICD-10-CM

## 2021-02-03 LAB — GROUP A STREP BY PCR: Group A Strep by PCR: NOT DETECTED

## 2021-02-03 LAB — CBC WITH DIFFERENTIAL/PLATELET
Abs Immature Granulocytes: 0.01 10*3/uL (ref 0.00–0.07)
Basophils Absolute: 0 10*3/uL (ref 0.0–0.1)
Basophils Relative: 1 %
Eosinophils Absolute: 0.2 10*3/uL (ref 0.0–0.5)
Eosinophils Relative: 3 %
HCT: 39.4 % (ref 36.0–46.0)
Hemoglobin: 12.8 g/dL (ref 12.0–15.0)
Immature Granulocytes: 0 %
Lymphocytes Relative: 20 %
Lymphs Abs: 1.5 10*3/uL (ref 0.7–4.0)
MCH: 27.6 pg (ref 26.0–34.0)
MCHC: 32.5 g/dL (ref 30.0–36.0)
MCV: 84.9 fL (ref 80.0–100.0)
Monocytes Absolute: 0.4 10*3/uL (ref 0.1–1.0)
Monocytes Relative: 5 %
Neutro Abs: 5.6 10*3/uL (ref 1.7–7.7)
Neutrophils Relative %: 71 %
Platelets: 287 10*3/uL (ref 150–400)
RBC: 4.64 MIL/uL (ref 3.87–5.11)
RDW: 14.2 % (ref 11.5–15.5)
WBC: 7.8 10*3/uL (ref 4.0–10.5)
nRBC: 0 % (ref 0.0–0.2)

## 2021-02-03 LAB — BASIC METABOLIC PANEL
Anion gap: 8 (ref 5–15)
BUN: 18 mg/dL (ref 6–20)
CO2: 23 mmol/L (ref 22–32)
Calcium: 8.8 mg/dL — ABNORMAL LOW (ref 8.9–10.3)
Chloride: 106 mmol/L (ref 98–111)
Creatinine, Ser: 1.12 mg/dL — ABNORMAL HIGH (ref 0.44–1.00)
GFR, Estimated: >60 mL/min (ref >60)
Glucose, Bld: 109 mg/dL — ABNORMAL HIGH (ref 70–99)
Potassium: 3.3 mmol/L — ABNORMAL LOW (ref 3.5–5.1)
Sodium: 137 mmol/L (ref 135–145)

## 2021-02-03 LAB — RESP PANEL BY RT-PCR (FLU A&B, COVID) ARPGX2
Influenza A by PCR: POSITIVE — AB
Influenza B by PCR: NEGATIVE
SARS Coronavirus 2 by RT PCR: POSITIVE — AB

## 2021-02-03 LAB — TROPONIN I (HIGH SENSITIVITY)
Troponin I (High Sensitivity): 4 ng/L (ref <18)
Troponin I (High Sensitivity): 5 ng/L (ref <18)

## 2021-02-03 LAB — D-DIMER, QUANTITATIVE: D-Dimer, Quant: 0.29 ug/mL-FEU (ref 0.00–0.50)

## 2021-02-03 LAB — HCG, SERUM, QUALITATIVE: Preg, Serum: NEGATIVE

## 2021-02-03 MED ORDER — LIDOCAINE VISCOUS HCL 2 % MT SOLN
15.0000 mL | Freq: Once | OROMUCOSAL | Status: AC
  Administered 2021-02-03: 11:00:00 15 mL via OROMUCOSAL
  Filled 2021-02-03: qty 15

## 2021-02-03 MED ORDER — SODIUM CHLORIDE 0.9 % IV BOLUS: 1000.0000 mL | Freq: Once | INTRAVENOUS | Status: DC

## 2021-02-03 MED ORDER — LYSINE 500 MG PO TABS: 500.0000 mg | ORAL_TABLET | Freq: Two times a day (BID) | ORAL | Status: DC

## 2021-02-03 MED ORDER — FLUTICASONE PROPIONATE 50 MCG/ACT NA SUSP
2.0000 | Freq: Every day | NASAL | Status: DC
  Administered 2021-02-03 – 2021-02-04 (×2): 2 via NASAL
  Filled 2021-02-03: qty 16

## 2021-02-03 MED ORDER — SUCRALFATE 1 GM/10ML PO SUSP
1.0000 g | Freq: Three times a day (TID) | ORAL | Status: DC
  Administered 2021-02-04 (×2): 1 g via ORAL
  Filled 2021-02-03 (×2): qty 10

## 2021-02-03 MED ORDER — PANTOPRAZOLE SODIUM 40 MG PO TBEC
40.0000 mg | DELAYED_RELEASE_TABLET | Freq: Every day | ORAL | Status: DC
  Administered 2021-02-04: 09:00:00 40 mg via ORAL
  Filled 2021-02-03: qty 1

## 2021-02-03 MED ORDER — ACETAMINOPHEN 325 MG PO TABS
650.0000 mg | ORAL_TABLET | Freq: Once | ORAL | Status: AC
  Administered 2021-02-03: 03:00:00 650 mg via ORAL
  Filled 2021-02-03: qty 2

## 2021-02-03 MED ORDER — SODIUM CHLORIDE 0.9 % IV BOLUS
1000.0000 mL | Freq: Once | INTRAVENOUS | Status: AC
  Administered 2021-02-03: 04:00:00 1000 mL via INTRAVENOUS

## 2021-02-03 MED ORDER — SODIUM CHLORIDE 0.9 % IV BOLUS
1000.0000 mL | Freq: Once | INTRAVENOUS | Status: AC
  Administered 2021-02-03: 03:00:00 1000 mL via INTRAVENOUS

## 2021-02-03 MED ORDER — OSELTAMIVIR PHOSPHATE 75 MG PO CAPS
75.0000 mg | ORAL_CAPSULE | Freq: Two times a day (BID) | ORAL | Status: DC
  Administered 2021-02-03 – 2021-02-04 (×3): 75 mg via ORAL
  Filled 2021-02-03 (×5): qty 1

## 2021-02-03 MED ORDER — ACETAMINOPHEN 325 MG PO TABS
650.0000 mg | ORAL_TABLET | Freq: Once | ORAL | Status: AC
  Administered 2021-02-03: 11:00:00 650 mg via ORAL
  Filled 2021-02-03: qty 2

## 2021-02-03 MED ORDER — MAGNESIUM GLUCONATE 500 MG PO TABS
500.0000 mg | ORAL_TABLET | Freq: Every day | ORAL | Status: DC
  Administered 2021-02-04: 09:00:00 500 mg via ORAL
  Filled 2021-02-03: qty 1

## 2021-02-03 MED ORDER — ENOXAPARIN SODIUM 40 MG/0.4ML IJ SOSY
40.0000 mg | PREFILLED_SYRINGE | INTRAMUSCULAR | Status: DC
  Administered 2021-02-03: 22:00:00 40 mg via SUBCUTANEOUS
  Filled 2021-02-03: qty 0.4

## 2021-02-03 MED ORDER — DULOXETINE HCL 60 MG PO CPEP
60.0000 mg | ORAL_CAPSULE | Freq: Two times a day (BID) | ORAL | Status: DC
  Administered 2021-02-03 – 2021-02-04 (×2): 60 mg via ORAL
  Filled 2021-02-03 (×2): qty 1

## 2021-02-03 MED ORDER — KETOROLAC TROMETHAMINE 30 MG/ML IJ SOLN
30.0000 mg | Freq: Once | INTRAMUSCULAR | Status: AC
  Administered 2021-02-03: 05:00:00 30 mg via INTRAVENOUS
  Filled 2021-02-03: qty 1

## 2021-02-03 MED ORDER — LORAZEPAM 1 MG PO TABS
1.0000 mg | ORAL_TABLET | Freq: Once | ORAL | Status: AC
  Administered 2021-02-03: 01:00:00 1 mg via ORAL
  Filled 2021-02-03: qty 1

## 2021-02-03 MED ORDER — FERROUS SULFATE 325 (65 FE) MG PO TABS
325.0000 mg | ORAL_TABLET | Freq: Every day | ORAL | Status: DC
  Administered 2021-02-04: 09:00:00 325 mg via ORAL
  Filled 2021-02-03: qty 1

## 2021-02-03 MED ORDER — SODIUM CHLORIDE 0.9 % IV SOLN: Freq: Once | INTRAVENOUS | Status: AC

## 2021-02-03 MED ORDER — LIDOCAINE VISCOUS HCL 2 % MT SOLN
15.0000 mL | Freq: Once | OROMUCOSAL | Status: AC
  Administered 2021-02-03: 03:00:00 15 mL via OROMUCOSAL
  Filled 2021-02-03: qty 15

## 2021-02-03 MED ORDER — IOHEXOL 350 MG/ML SOLN
100.0000 mL | Freq: Once | INTRAVENOUS | Status: AC | PRN
  Administered 2021-02-03: 03:00:00 100 mL via INTRAVENOUS

## 2021-02-03 MED ORDER — LACTATED RINGERS IV SOLN: INTRAVENOUS | Status: AC

## 2021-02-03 MED ORDER — VITAMIN D3 25 MCG (1000 UNIT) PO TABS
2000.0000 [IU] | ORAL_TABLET | Freq: Every day | ORAL | Status: DC
Start: 2021-02-04 — End: 2021-02-04
  Administered 2021-02-04: 09:00:00 2000 [IU] via ORAL
  Filled 2021-02-03: qty 2

## 2021-02-03 MED ORDER — IBUPROFEN 800 MG PO TABS
800.0000 mg | ORAL_TABLET | Freq: Once | ORAL | Status: AC
  Administered 2021-02-03: 03:00:00 800 mg via ORAL
  Filled 2021-02-03: qty 1

## 2021-02-03 MED ORDER — OSELTAMIVIR PHOSPHATE 75 MG PO CAPS: 75.0000 mg | ORAL_CAPSULE | Freq: Two times a day (BID) | ORAL | 0 refills | Status: AC

## 2021-02-03 MED ORDER — LORAZEPAM 2 MG/ML IJ SOLN
1.0000 mg | Freq: Once | INTRAMUSCULAR | Status: AC
  Administered 2021-02-03: 05:00:00 1 mg via INTRAVENOUS
  Filled 2021-02-03: qty 1

## 2021-02-03 MED ORDER — TIZANIDINE HCL 4 MG PO TABS: 4.0000 mg | ORAL_TABLET | Freq: Every day | ORAL | Status: DC

## 2021-02-03 MED ORDER — TIZANIDINE HCL 4 MG PO TABS
4.0000 mg | ORAL_TABLET | Freq: Every day | ORAL | Status: DC
  Administered 2021-02-03 – 2021-02-04 (×2): 4 mg via ORAL
  Filled 2021-02-03 (×2): qty 1

## 2021-02-03 MED ORDER — NIRMATRELVIR/RITONAVIR (PAXLOVID)TABLET: 3.0000 | ORAL_TABLET | Freq: Two times a day (BID) | ORAL | 0 refills | Status: AC

## 2021-02-03 MED ORDER — NIRMATRELVIR/RITONAVIR (PAXLOVID)TABLET
3.0000 | ORAL_TABLET | Freq: Two times a day (BID) | ORAL | Status: DC
  Administered 2021-02-03 – 2021-02-04 (×2): 3 via ORAL
  Filled 2021-02-03: qty 30

## 2021-02-03 MED ORDER — ACETAMINOPHEN 325 MG PO TABS
650.0000 mg | ORAL_TABLET | Freq: Four times a day (QID) | ORAL | Status: DC | PRN
  Administered 2021-02-03 – 2021-02-04 (×2): 650 mg via ORAL
  Filled 2021-02-03 (×2): qty 2

## 2021-02-03 MED ORDER — POTASSIUM CHLORIDE 20 MEQ PO PACK
40.0000 meq | PACK | Freq: Once | ORAL | Status: AC
  Administered 2021-02-03: 23:00:00 40 meq via ORAL
  Filled 2021-02-03: qty 2

## 2021-02-03 NOTE — H&P (Signed)
History and Physical    Paige Hamilton WUJ:[id number] DOB: 1980-05-01 DOA: 02/03/2021  PCP: Gillian Scarce, MD  Patient coming from: [facility]  I have personally briefly reviewed patient's old medical records in [facility]  Chief Complaint: Persistent fever, shortness of breath  HPI: Paige Hamilton is a 40 y.o. female with medical history significant for fibromyalgia, GERD, and prediabetes who presents with concerns of persistent fever and shortness of breath.  Patient reports that 2 days ago she began to have a severe sore throat.  She was taking care of her father who has COVID at home.  She also subsequently developed coughing, runny nose, body ache, headache, loss of taste and fever.  She was at a wedding yesterday and also broke out in sweats.  Has been taking Tylenol at home without relief.  Has been noting intermittent episodes of palpitation.  Had multiple episodes that woke her out of her sleep and is making her afraid to fall asleep.  Denies any chest pain.  Has nausea but no vomiting.  No diarrhea.  Has not been able to eat for the past 2 days.  ED Course: She was afebrile with temperature of 99.7 F, tachycardic with rates up to 118, RR of 30, normotensive on room air.  CBC unremarkable. Sodium 137, K of 3.3, creatinine of 1.12 from 0.5.  BG of 109.  CTA chest negative for PE.  She was positive for influenza A and COVID-started on pack Paxlovid and Tamiflu. Also given 2L of IV fluids.   Review of Systems: Constitutional: No Weight Change, + Fever ENT/Mouth: +sore throat, + Rhinorrhea Eyes: No Vision Changes Cardiovascular: No Chest Pain,+SOB, +Palpitations Respiratory: + Cough, + Sputum, No Wheezing, no Dyspnea  Gastrointestinal: + Nausea, No Vomiting, No Diarrhea, No Constipation, No Pain Genitourinary: no Urinary Incontinence, No Urgency, No Flank Pain Musculoskeletal: No Arthralgias, + Myalgias Skin: No Skin Lesions, No Pruritus Neuro: +Weakness, No  Numbness Psych: No Anxiety/Panic, No Depression, + decrease appetite Heme/Lymph: No Bruising, No Bleeding  Social Denies tobacco, alcohol,or illicit drug use  Past Medical History:  Diagnosis Date   Chronic pain    Edema leg    Fibromyalgia    GERD (gastroesophageal reflux disease)    High risk HPV infection    Hirsutism    Hyperlipidemia    IFG (impaired fasting glucose)    Menorrhagia    PCOS (polycystic ovarian syndrome)    Tuberculosis     Past Surgical History:  Procedure Laterality Date   ABDOMINAL SURGERY     CHOLECYSTECTOMY     GASTRECTOMY     LAPAROSCOPIC GASTRIC SLEEVE RESECTION        No Known Allergies  Family History  Problem Relation Age of Onset   Heart disease Mother        CVD   Diabetes type II Mother    Hypertension Mother    Hyperlipidemia Mother    Diabetes type II Father    Hypertension Father    Hyperlipidemia Father    Diabetes type II Brother    Hypertension Brother    Asthma Other        Niece     Prior to Admission medications   Medication Sig Start Date End Date Taking? Authorizing Provider  Cholecalciferol (VITAMIN D) 50 MCG (2000 UT) tablet Take 2,000 Units by mouth daily.   Yes [provider]  cyanocobalamin (,VITAMIN B-12,) 1000 MCG/ML injection Inject 1,000 mcg into the muscle every 14 (fourteen) days.  Yes [provider]  DULoxetine (CYMBALTA) 60 MG capsule Take 60 mg by mouth 2 (two) times daily.   Yes [provider]  ferrous sulfate 325 (65 FE) MG tablet Take 325 mg by mouth daily.   Yes [provider]  ibuprofen (ADVIL) 800 MG tablet Take 800 mg by mouth every 8 (eight) hours as needed for moderate pain. 11/09/20  Yes [provider]  Lysine 500 MG TABS Take 500 mg by mouth 2 (two) times daily.   Yes [provider]  magnesium gluconate (MAGONATE) 500 MG tablet Take 500 mg by mouth daily.   Yes [provider]  nirmatrelvir/ritonavir EUA (PAXLOVID) 20 x  150 MG & 10 x 100MG  TABS Take 3 tablets by mouth 2 (two) times daily for 5 days. Patient GFR is 60. Take nirmatrelvir (150 mg) two tablets twice daily for 5 days and ritonavir (100 mg) one tablet twice daily for 5 days. 02/03/21 02/08/21 Yes Rancour, 02/10/21, MD  omeprazole (PRILOSEC) 20 MG capsule Take 20 mg by mouth daily.   Yes [provider]  oseltamivir (TAMIFLU) 75 MG capsule Take 1 capsule (75 mg total) by mouth every 12 (twelve) hours. 02/03/21  Yes Rancour, 02/05/21, MD  spironolactone (ALDACTONE) 50 MG tablet Take 50 mg by mouth daily. 11/20/20  Yes [provider]  tirzepatide 01/20/21) 2.5 MG/0.5ML Pen Inject 2.5 mg into the skin once a week. 01/03/21  Yes [provider]  tiZANidine (ZANAFLEX) 4 MG tablet Take 4 mg by mouth daily.   Yes [provider]  albuterol (VENTOLIN HFA) 108 (90 Base) MCG/ACT inhaler Inhale 2 puffs into the lungs every 4 (four) hours as needed for wheezing or shortness of breath. Patient not taking: Reported on 02/03/2021 10/13/20   12/13/20, MD  benzonatate (TESSALON) 200 MG capsule Take 1 capsule (200 mg total) by mouth 3 (three) times daily as needed for cough. Patient not taking: Reported on 02/03/2021 10/13/20   12/13/20, MD  famotidine (PEPCID) 20 MG tablet Take 1 tablet (20 mg total) by mouth 2 (two) times daily. Patient not taking: Reported on 02/03/2021 07/19/20   09/18/20, PA-C  famotidine (PEPCID) 20 MG tablet Take 1 tablet (20 mg total) by mouth 2 (two) times daily. Patient not taking: Reported on 02/03/2021 10/13/20   12/13/20, MD  HYDROcodone bit-homatropine (HYCODAN) 5-1.5 MG/5ML syrup Take 5 mLs by mouth every 6 (six) hours as needed for cough. Patient not taking: Reported on 02/03/2021 10/13/20   12/13/20, MD  Spacer/Aero-Holding Chambers (AEROCHAMBER PLUS WITH MASK) inhaler Use as instructed 10/13/20   12/13/20, MD  sucralfate (CARAFATE) 1 GM/10ML suspension Take 10 mLs (1 g  total) by mouth 4 (four) times daily -  with meals and at bedtime. Patient not taking: Reported on 02/03/2021 07/19/20   09/18/20, PA-C  dicyclomine (BENTYL) 20 MG tablet Take 1 tablet (20 mg total) by mouth 2 (two) times daily. 07/22/18 07/19/20  09/18/20, MD  pantoprazole (PROTONIX) 20 MG tablet Take 1 tablet (20 mg total) by mouth 2 (two) times daily. 10/21/17 07/19/20  Mortis, 09/18/20 I, PA-C  prochlorperazine (COMPAZINE) 10 MG tablet Take 1 tablet (10 mg total) by mouth 2 (two) times daily as needed for nausea. 12/17/18 07/19/20  09/18/20, MD    Physical Exam: Vitals:   02/03/21 1452 02/03/21 1700 02/03/21 1800 02/03/21 1954  BP:  128/76 133/75 132/79  Pulse:  93 (!) 103 96  Resp:  (!) 27 16  20  Temp: 97.9 F (36.6 C)   98.5 F (36.9 C)  TempSrc: Oral   Oral  SpO2:  100% 100% 100%  Weight:      Height:     (1.727 m)    Constitutional: Ill-appearing diaphoretic middle-aged female sitting upright in bed initially eating a salad Vitals:   02/03/21 1452 02/03/21 1700 02/03/21 1800 02/03/21 1954  BP:  128/76 133/75 132/79  Pulse:  93 (!) 103 96  Resp:  (!) Temp: 97.9 F (36.6 C)   98.5 F (36.9 C)  TempSrc: Oral   Oral  SpO2:  100% 100% 100%  Weight:      Height:     (1.727 m)   Eyes: lids and conjunctivae normal ENMT: Mucous membranes are moist.  Neck: normal, supple Respiratory: clear to auscultation bilaterally, no wheezing, no crackles. Normal respiratory effort on room air. No accessory muscle use.  Able to speak in full sentences Cardiovascular: Regular rate and rhythm, no murmurs / rubs / gallops. No extremity edema.  Abdomen: no tenderness,  Bowel sounds positive.  Musculoskeletal: no clubbing / cyanosis. No joint deformity upper and lower extremities. Normal muscle tone.  Skin: no rashes, lesions, ulcers. No induration Neurologic: CN 2-12 grossly intact.  Strength 5/5 in all 4.  Psychiatric: Normal judgment and insight. Alert and  oriented x 3. Normal mood.    Labs on Admission: I have personally reviewed following labs and imaging studies  CBC: Recent Labs  Lab 02/03/21 0135  WBC 7.8  NEUTROABS 5.6  HGB 12.8  HCT 39.4  MCV 84.9  PLT 287   Basic Metabolic Panel: Recent Labs  Lab 02/03/21 0135  NA 137  K 3.3*  CL 106  CO2 23  GLUCOSE 109*  BUN 18  CREATININE 1.12*  CALCIUM 8.8*   GFR: Estimated Creatinine Clearance: 83.6 mL/min (A) (by C-G formula based on SCr of 1.12 mg/dL (H)). Liver Function Tests: No results for input(s): AST, ALT, ALKPHOS, BILITOT, PROT, ALBUMIN in the last 168 hours. No results for input(s): LIPASE, AMYLASE in the last 168 hours. No results for input(s): AMMONIA in the last 168 hours. Coagulation Profile: No results for input(s): INR, PROTIME in the last 168 hours. Cardiac Enzymes: No results for input(s): CKTOTAL, CKMB, CKMBINDEX, TROPONINI in the last 168 hours. BNP (last 3 results) No results for input(s): PROBNP in the last 8760 hours. HbA1C: No results for input(s): HGBA1C in the last 72 hours. CBG: No results for input(s): GLUCAP in the last 168 hours. Lipid Profile: No results for input(s): CHOL, HDL, LDLCALC, TRIG, CHOLHDL, LDLDIRECT in the last 72 hours. Thyroid Function Tests: No results for input(s): TSH, T4TOTAL, FREET4, T3FREE, THYROIDAB in the last 72 hours. Anemia Panel: No results for input(s): VITAMINB12, FOLATE, FERRITIN, TIBC, IRON, RETICCTPCT in the last 72 hours. Urine analysis:    Component Value Date/Time   COLORURINE YELLOW 10/13/2020 1649   APPEARANCEUR CLOUDY (A) 10/13/2020 1649   LABSPEC 1.030 10/13/2020 1649   PHURINE 5.0 10/13/2020 1649   GLUCOSEU NEGATIVE 10/13/2020 1649   HGBUR LARGE (A) 10/13/2020 1649   BILIRUBINUR SMALL (A) 10/13/2020 1649   BILIRUBINUR Neg 01/15/2013 1541   KETONESUR 15 (A) 10/13/2020 1649   PROTEINUR 30 (A) 10/13/2020 1649   UROBILINOGEN negative 01/15/2013 1541   NITRITE NEGATIVE 10/13/2020 1649    LEUKOCYTESUR TRACE (A) 10/13/2020 1649    Radiological Exams on Admission: DG Chest 2 View  Result Date: 02/03/2021 CLINICAL DATA:  Shortness  of breath for 2 days, recent COVID exposure, initial encounter EXAM: CHEST - 2 VIEW COMPARISON:  10/13/2020 FINDINGS: The heart size and mediastinal contours are within normal limits. Both lungs are clear. The visualized skeletal structures are unremarkable. IMPRESSION: No active cardiopulmonary disease. Electronically Signed   By: Alcide Clever M.D.   On: 02/03/2021 02:16   CT Angio Chest PE W and/or Wo Contrast  Result Date: 02/03/2021 CLINICAL DATA:  Concern for pulmonary embolism. EXAM: CT ANGIOGRAPHY CHEST WITH CONTRAST TECHNIQUE: Multidetector CT imaging of the chest was performed using the standard protocol during bolus administration of intravenous contrast. Multiplanar CT image reconstructions and MIPs were obtained to evaluate the vascular anatomy. CONTRAST:  OMNIPAQUE IOHEXOL 350 MG/ML SOLN COMPARISON:  Chest radiograph dated 02/03/2021. FINDINGS: Evaluation of this exam is limited due to respiratory motion artifact. Cardiovascular: There is no cardiomegaly or pericardial effusion. The thoracic aorta is unremarkable. The origins of the great vessels of the aortic arch appear patent. Evaluation of the pulmonary arteries is limited due to respiratory motion artifact and suboptimal opacification and timing of the contrast. No definite pulmonary artery embolus identified. Mediastinum/Nodes: No hilar or mediastinal adenopathy. The esophagus is grossly unremarkable. No mediastinal fluid collection. Lungs/Pleura: The lungs are clear. There is no pleural effusion or pneumothorax. The central airways are patent. Upper Abdomen: Fatty liver. Cholecystectomy. Postsurgical changes of the stomach. Musculoskeletal: Mild degenerative changes. No acute osseous pathology. Review of the MIP images confirms the above findings. IMPRESSION: 1. No acute intrathoracic  pathology. No CT evidence of pulmonary embolism. 2. Fatty liver. Electronically Signed   By: Elgie Collard M.D.   On: 02/03/2021 03:20      Assessment/Plan  Sepsis from Influenza A and COVID-viral illness pt presented with tachycardia, tachypnea and was found positive for influenza A and COVID -continue Paxlovid x 5 days -continue Tamiflu   Palpitations Tachycardia EKG showing sinus tachycardia with RSR' in V1 and V2, no other ST or T wave changes -Trop reassuring at 4 and 5  -likely due to ongoing illness -continue to monitor on telemetry   AKI -creatinine of 1.12 from prior of 0.5 -Has received 2 L of IV normal saline in the ED.  Continuous 75 cc/h fluid overnight.  Hypokalemia - K of 3.3.  Replete with oral potassium  Fibromyalgia Continue Cymbalta, tizanidine  GERD Continue PPI, Carafate  DVT prophylaxis:.Lovenox Code Status: Full Family Communication: Plan discussed with patient at bedside  disposition Plan: Home with observation Consults called:  Admission status: Observation   Level of care: Telemetry  Status is: Observation  The patient remains OBS appropriate and will d/c before 2 midnights.        Anselm Jungling DO [facility]   If 7PM-7AM, please contact night-coverage [email or web address]   02/03/2021, 10:10 PM

## 2021-02-03 NOTE — Plan of Care (Signed)
[facility] transfer note:  40 yo F with COVID and influenza A.  Difficulty breathing, tachycardic and tachypnic.  EDP wants admit for tachycardia.  CTA neg for PE or PNA.  Got started on paxlovid and tamiflu.  EDP wants admit.  Will put in for obs  [facility] will assume care on arrival to accepting facility. Until arrival, care as per EDP. However, [facility] available 24/7 for questions and assistance.  Nursing staff, please page [facility] Admits and Consults [phone]) as soon as the patient arrives the hospital.

## 2021-02-03 NOTE — ED Triage Notes (Addendum)
SOB  X 2 days, some family has tested + for covid. Has been trying home meds but has gotten worse. States has had fever at home.

## 2021-02-03 NOTE — ED Provider Notes (Signed)
Patient reevaluated still feeling really sick.  Too weak to get up and walk around.  Still feeling very achy and short of breath at times.  We will give her additional Tylenol.  Maintenance fluids started.  She has been given first dose of Tamiflu and Paxil.  Still desires admission which I think is reasonable given her severity of illness and fatigue.   Virgina Norfolk, DO 02/03/21 1057

## 2021-02-03 NOTE — ED Notes (Signed)
ED Provider at bedside. 

## 2021-02-03 NOTE — Discharge Instructions (Signed)
Your testing is positive for both COVID and flu.  Keep yourself hydrated at home.  Use Tylenol or Motrin as needed for aches and fevers.  Follow-up with your doctor but keep yourself quarantined for at least 5 days from the start of symptoms.  Return to the ED with difficulty breathing, not able to eat or drink, chest pain, persistent vomiting or any other concerns.

## 2021-02-03 NOTE — ED Provider Notes (Signed)
[facility] EMERGENCY DEPARTMENT Provider Note   CSN: [id number] Arrival date & time: 02/03/21  0102     History Chief Complaint  Patient presents with   Fever   Cough   Covid Exposure    Paige Hamilton is a 40 y.o. female.  Patient with a history of fibromyalgia and PCOS here with difficulty breathing for the past several hours with tightness in her chest.  States she was at a wedding with her family today and suddenly became short of breath with tightness in her chest.  Has a nonproductive cough.  States URI symptoms started yesterday with cough, runny nose, sore throat, headache, body aches, fever up to 102.  Has been using Tylenol and Motrin at home with partial relief.  Has been exposed to someone with COVID.  States she was not having difficulty breathing until this evening while she was at the wedding.  Denies any history of asthma or COPD.  Denies any history of blood clots.  Denies any cardiac history. Has had nausea and upper abdominal pain as well but no vomiting.  No leg pain or leg swelling.  Denies possibility of pregnancy. Has had several episodes of diarrhea over the past 2 days. Diarrhea has been nonbloody.   The history is provided by the patient.  Fever Associated symptoms: chills, congestion, cough, headaches, rhinorrhea and sore throat   Associated symptoms: no chest pain, no nausea, no rash and no vomiting   Cough Associated symptoms: chills, fever, headaches, rhinorrhea, shortness of breath and sore throat   Associated symptoms: no chest pain, no rash and no wheezing       Past Medical History:  Diagnosis Date   Chronic pain    Edema leg    Fibromyalgia    GERD (gastroesophageal reflux disease)    High risk HPV infection    Hirsutism    Hyperlipidemia    IFG (impaired fasting glucose)    Menorrhagia    PCOS (polycystic ovarian syndrome)    Tuberculosis     Patient Active Problem List   Diagnosis Date Noted   Other malaise and fatigue  01/16/2013   Depression, acute 01/16/2013   Nausea alone 05/04/2012   Obesity, unspecified 05/04/2012    Past Surgical History:  Procedure Laterality Date   ABDOMINAL SURGERY     CHOLECYSTECTOMY     GASTRECTOMY     LAPAROSCOPIC GASTRIC SLEEVE RESECTION       OB History   No obstetric history on file.     Family History  Problem Relation Age of Onset   Heart disease Mother        CVD   Diabetes type II Mother    Hypertension Mother    Hyperlipidemia Mother    Diabetes type II Father    Hypertension Father    Hyperlipidemia Father    Diabetes type II Brother    Hypertension Brother    Asthma Other        Niece    Social History   Tobacco Use   Smoking status: Never   Smokeless tobacco: Never  Vaping Use   Vaping Use: Never used  Substance Use Topics   Alcohol use: No   Drug use: No    Home Medications Prior to Admission medications   Medication Sig Start Date End Date Taking? Authorizing Provider  albuterol (VENTOLIN HFA) 108 (90 Base) MCG/ACT inhaler Inhale 2 puffs into the lungs every 4 (four) hours as needed for wheezing or shortness of breath.  10/13/20   Arby Barrette, MD  benzonatate (TESSALON) 200 MG capsule Take 1 capsule (200 mg total) by mouth 3 (three) times daily as needed for cough. 10/13/20   Arby Barrette, MD  DULoxetine HCl (CYMBALTA PO) Take by mouth.    [provider]  famotidine (PEPCID) 20 MG tablet Take 1 tablet (20 mg total) by mouth 2 (two) times daily. 07/19/20   Renne Crigler, PA-C  famotidine (PEPCID) 20 MG tablet Take 1 tablet (20 mg total) by mouth 2 (two) times daily. 10/13/20   Arby Barrette, MD  HYDROcodone bit-homatropine (HYCODAN) 5-1.5 MG/5ML syrup Take 5 mLs by mouth every 6 (six) hours as needed for cough. 10/13/20   Arby Barrette, MD  Lisdexamfetamine Dimesylate (VYVANSE PO) Take by mouth.    [provider]  ROPINIRole HCl (REQUIP PO) Take by mouth.    [provider]  Spacer/Aero-Holding Chambers  (AEROCHAMBER PLUS WITH MASK) inhaler Use as instructed 10/13/20   Arby Barrette, MD  sucralfate (CARAFATE) 1 GM/10ML suspension Take 10 mLs (1 g total) by mouth 4 (four) times daily -  with meals and at bedtime. 07/19/20   Renne Crigler, PA-C  TIZANIDINE HCL PO Take by mouth.    [provider]  dicyclomine (BENTYL) 20 MG tablet Take 1 tablet (20 mg total) by mouth 2 (two) times daily. 07/22/18 07/19/20  Alvira Monday, MD  omeprazole (PRILOSEC) 20 MG capsule Take 1 capsule (20 mg total) by mouth daily. 03/14/17 07/19/20  Joy, Shawn C, PA-C  pantoprazole (PROTONIX) 20 MG tablet Take 1 tablet (20 mg total) by mouth 2 (two) times daily. 10/21/17 07/19/20  Mortis, Jerrel Ivory I, PA-C  prochlorperazine (COMPAZINE) 10 MG tablet Take 1 tablet (10 mg total) by mouth 2 (two) times daily as needed for nausea. 12/17/18 07/19/20  Sabas Sous, MD    Allergies    Patient has no known allergies.  Review of Systems   Review of Systems  Constitutional:  Positive for chills and fever.  HENT:  Positive for congestion, rhinorrhea, sore throat and trouble swallowing.   Respiratory:  Positive for cough, chest tightness and shortness of breath. Negative for wheezing.   Cardiovascular:  Negative for chest pain.  Gastrointestinal:  Negative for abdominal pain, nausea and vomiting.  Musculoskeletal:  Positive for arthralgias.  Skin:  Negative for rash.  Neurological:  Positive for weakness and headaches.   all other systems are negative except as noted in the HPI and PMH.   Physical Exam Updated Vital Signs BP 124/72 (BP Location: Right Arm)    Pulse (!) 122    Temp 98.2 F (36.8 C) (Oral)    Resp (!) 26    LMP  (LMP Unknown)    SpO2 99%   Physical Exam Vitals and nursing note reviewed.  Constitutional:      General: She is not in acute distress.    Appearance: She is well-developed. She is ill-appearing.     Comments: Anxious and tearful Tachypneic, speaking in short phrases  HENT:     Head:  Normocephalic and atraumatic.     Mouth/Throat:     Pharynx: No oropharyngeal exudate.  Eyes:     Conjunctiva/sclera: Conjunctivae normal.     Pupils: Pupils are equal, round, and reactive to light.  Neck:     Comments: No meningismus. Cardiovascular:     Rate and Rhythm: Regular rhythm. Tachycardia present.     Heart sounds: Normal heart sounds. No murmur heard. Pulmonary:     Effort: Respiratory  distress present.     Breath sounds: Normal breath sounds. No wheezing.     Comments: Clear bilaterally, no wheezing, good air exchange Tachypnea 30s. Chest:     Chest wall: No tenderness.  Abdominal:     Palpations: Abdomen is soft.     Tenderness: There is no abdominal tenderness. There is no guarding or rebound.  Musculoskeletal:        General: No tenderness. Normal range of motion.     Cervical back: Normal range of motion and neck supple.  Skin:    General: Skin is warm.     Findings: No rash.  Neurological:     Mental Status: She is alert and oriented to person, place, and time.     Cranial Nerves: No cranial nerve deficit.     Motor: No abnormal muscle tone.     Coordination: Coordination normal.     Comments:  5/5 strength throughout. CN 2-12 intact.Equal grip strength.   Psychiatric:        Behavior: Behavior normal.    ED Results / Procedures / Treatments   Labs (all labs ordered are listed, but only abnormal results are displayed) Labs Reviewed  RESP PANEL BY RT-PCR (FLU A&B, COVID) ARPGX2 - Abnormal; Notable for the following components:      Result Value   SARS Coronavirus 2 by RT PCR POSITIVE (*)    Influenza A by PCR POSITIVE (*)    All other components within normal limits  BASIC METABOLIC PANEL - Abnormal; Notable for the following components:   Potassium 3.3 (*)    Glucose, Bld 109 (*)    Creatinine, Ser 1.12 (*)    Calcium 8.8 (*)    All other components within normal limits  GROUP A STREP BY PCR  D-DIMER, QUANTITATIVE  HCG, SERUM, QUALITATIVE  CBC  WITH DIFFERENTIAL/PLATELET  TROPONIN I (HIGH SENSITIVITY)  TROPONIN I (HIGH SENSITIVITY)    EKG EKG Interpretation  Date/Time:  Saturday February 03 2021 01:30:22 EST Ventricular Rate:  112 PR Interval:  140 QRS Duration: 79 QT Interval:  296 QTC Calculation: 404 R Axis:   5 Text Interpretation: Sinus tachycardia RSR' in V1 or V2, probably normal variant Nonspecific T abnormalities, inferior leads Nonspecific T wave abnormality Q3T3 Confirmed by Glynn Octave [phone]) on 02/03/2021 1:35:35 AM  Radiology DG Chest 2 View  Result Date: 02/03/2021 CLINICAL DATA:  Shortness of breath for 2 days, recent COVID exposure, initial encounter EXAM: CHEST - 2 VIEW COMPARISON:  10/13/2020 FINDINGS: The heart size and mediastinal contours are within normal limits. Both lungs are clear. The visualized skeletal structures are unremarkable. IMPRESSION: No active cardiopulmonary disease. Electronically Signed   By: Alcide Clever M.D.   On: 02/03/2021 02:16    Procedures Procedures   Medications Ordered in ED Medications  LORazepam (ATIVAN) tablet 1 mg (has no administration in time range)    ED Course  I have reviewed the triage vital signs and the nursing notes.  Pertinent labs & imaging results that were available during my care of the patient were reviewed by me and considered in my medical decision making (see chart for details).    MDM Rules/Calculators/A&P                         2 days of URI symptoms with cough, fever, sore throat, congestion and chills.  Sudden onset of chest pain or shortness of breath this evening.  Tachycardic and tachypneic but not hypoxic.  Lungs are clear.  Patient be given dose of Ativan for component of anxiety.  Will check EKG, chest x-ray, D-dimer to evaluate for pulmonary embolism.  Chest x-ray is clear.  Troponin is negative and D-dimer is negative as well.  Remains tachycardic but afebrile.  No hypoxia. IV fluids, Motrin Tylenol given.  Positive  for both flu and COVID.  Symptoms started within the past 2 days. Given positive COVID test and persistent tachycardia, she is high risk for pulmonary embolism as source of her chest pain or shortness of breath will obtain imaging despite negative D-dimer  CT negative for pulmonary embolism or other acute pathology. Tachycardia improving with IV fluids.  Patient within window for both Tamiflu and paxlovid.  Risks and benefits discussed and she agrees to proceed.  Medication list reviewed and no appreciable interactions.  She is no longer on Vyvanse or Requip.  Patient continues to feel poorly despite IV hydration. She is tachycardic in the 110s and 120s and tachypneic but not hypoxic.  Complains of palpitations and not being able to breathe but is saturating 99% on room air with clear lungs.  No evidence of pneumonia or pulmonary embolism. No indication for IV remdesivir or steroids.  Given persistently increased work of breathing and palpitations and tachypnea she is agreeable to observation admission.  Tamiflu and Paxlovid will be initiated.  Discussed with Dr. Julian Reil    Final Clinical Impression(s) / ED Diagnoses Final diagnoses:  COVID-19  Influenza    Rx / DC Orders ED Discharge Orders     None        Makaila Windle, Jeannett Senior, MD 02/03/21 [phone]

## 2021-02-03 NOTE — Progress Notes (Signed)
PHARMACIST - PHYSICIAN ORDER COMMUNICATION  CONCERNING: P&T Medication Policy on Herbal Medications  DESCRIPTION:  This patients order for:  Lysine  has been noted.  This product(s) is classified as an herbal or natural product. Due to a lack of definitive safety studies or FDA approval, nonstandard manufacturing practices, plus the potential risk of unknown drug-drug interactions while on inpatient medications, the Pharmacy and Therapeutics Committee does not permit the use of herbal or natural products of this type within [facility].   ACTION TAKEN: The pharmacy department is unable to verify this order at this time and your patient has been informed of this safety policy. Please reevaluate patients clinical condition at discharge and address if the herbal or natural product(s) should be resumed at that time.   Terrilee Files, PharmD

## 2021-02-04 DIAGNOSIS — N179 Acute kidney failure, unspecified: Secondary | ICD-10-CM | POA: Diagnosis not present

## 2021-02-04 DIAGNOSIS — U071 COVID-19: Secondary | ICD-10-CM | POA: Diagnosis not present

## 2021-02-04 DIAGNOSIS — A419 Sepsis, unspecified organism: Secondary | ICD-10-CM | POA: Diagnosis not present

## 2021-02-04 LAB — CBC WITH DIFFERENTIAL/PLATELET
Abs Immature Granulocytes: 0.01 10*3/uL (ref 0.00–0.07)
Basophils Absolute: 0 10*3/uL (ref 0.0–0.1)
Basophils Relative: 0 %
Eosinophils Absolute: 0.1 10*3/uL (ref 0.0–0.5)
Eosinophils Relative: 2 %
HCT: 32.1 % — ABNORMAL LOW (ref 36.0–46.0)
Hemoglobin: 10.5 g/dL — ABNORMAL LOW (ref 12.0–15.0)
Immature Granulocytes: 0 %
Lymphocytes Relative: 36 %
Lymphs Abs: 2.1 10*3/uL (ref 0.7–4.0)
MCH: 27.8 pg (ref 26.0–34.0)
MCHC: 32.7 g/dL (ref 30.0–36.0)
MCV: 84.9 fL (ref 80.0–100.0)
Monocytes Absolute: 0.4 10*3/uL (ref 0.1–1.0)
Monocytes Relative: 8 %
Neutro Abs: 3.1 10*3/uL (ref 1.7–7.7)
Neutrophils Relative %: 54 %
Platelets: 227 10*3/uL (ref 150–400)
RBC: 3.78 MIL/uL — ABNORMAL LOW (ref 3.87–5.11)
RDW: 14.6 % (ref 11.5–15.5)
WBC: 5.8 10*3/uL (ref 4.0–10.5)
nRBC: 0 % (ref 0.0–0.2)

## 2021-02-04 LAB — COMPREHENSIVE METABOLIC PANEL
ALT: 16 U/L (ref 0–44)
AST: 19 U/L (ref 15–41)
Albumin: 3.2 g/dL — ABNORMAL LOW (ref 3.5–5.0)
Alkaline Phosphatase: 47 U/L (ref 38–126)
Anion gap: 4 — ABNORMAL LOW (ref 5–15)
BUN: 11 mg/dL (ref 6–20)
CO2: 24 mmol/L (ref 22–32)
Calcium: 8.1 mg/dL — ABNORMAL LOW (ref 8.9–10.3)
Chloride: 111 mmol/L (ref 98–111)
Creatinine, Ser: 0.51 mg/dL (ref 0.44–1.00)
GFR, Estimated: >60 mL/min (ref >60)
Glucose, Bld: 117 mg/dL — ABNORMAL HIGH (ref 70–99)
Potassium: 3.6 mmol/L (ref 3.5–5.1)
Sodium: 139 mmol/L (ref 135–145)
Total Bilirubin: 0.5 mg/dL (ref 0.3–1.2)
Total Protein: 6.2 g/dL — ABNORMAL LOW (ref 6.5–8.1)

## 2021-02-04 LAB — HIV ANTIBODY (ROUTINE TESTING W REFLEX): HIV Screen 4th Generation wRfx: NONREACTIVE

## 2021-02-04 MED ORDER — OSELTAMIVIR PHOSPHATE 75 MG PO CAPS
75.0000 mg | ORAL_CAPSULE | Freq: Once | ORAL | Status: AC
  Administered 2021-02-04: 11:00:00 75 mg via ORAL
  Filled 2021-02-04: qty 1

## 2021-02-04 MED ORDER — HYDROXYZINE HCL 25 MG PO TABS
25.0000 mg | ORAL_TABLET | Freq: Once | ORAL | Status: AC
  Administered 2021-02-04: 02:00:00 25 mg via ORAL
  Filled 2021-02-04: qty 1

## 2021-02-04 MED ORDER — FLUTICASONE PROPIONATE 50 MCG/ACT NA SUSP: 2.0000 | Freq: Every day | NASAL | 1 refills | Status: AC

## 2021-02-04 MED ORDER — ACETAMINOPHEN 325 MG PO TABS: 650.0000 mg | ORAL_TABLET | ORAL | Status: AC | PRN

## 2021-02-04 MED ORDER — IBUPROFEN 200 MG PO TABS: 400.0000 mg | ORAL_TABLET | Freq: Three times a day (TID) | ORAL | Status: AC | PRN

## 2021-02-04 MED ORDER — POLYETHYLENE GLYCOL 3350 17 G PO PACK
17.0000 g | PACK | Freq: Every day | ORAL | Status: DC
  Administered 2021-02-04: 11:00:00 17 g via ORAL
  Filled 2021-02-04: qty 1

## 2021-02-04 MED ORDER — PHENOL 1.4 % MT LIQD
1.0000 | OROMUCOSAL | Status: DC | PRN
  Administered 2021-02-04: 11:00:00 1 via OROMUCOSAL
  Filled 2021-02-04: qty 177

## 2021-02-04 NOTE — Discharge Summary (Signed)
Physician Discharge Summary   Paige Hamilton EXH:[id number] DOB: Jan 25, 1981 DOA: 02/03/2021  PCP: Gillian Scarce, MD  Admit date: 02/03/2021 Discharge date:  02/04/2021  Admitted From: Home Disposition:  Home Discharging physician: Lewie Chamber, MD  Recommendations for Outpatient Follow-up:  Continue routine care  Home Health:  Equipment/Devices:   Discharge Condition: stable CODE STATUS: Full Diet recommendation:  Diet Orders (From admission, onward)     Start     Ordered   02/03/21 2357  Diet Heart Room service appropriate? Yes; Fluid consistency: Thin  Diet effective now       Comments: No chicken, beef, or pork.  Question Answer Comment  Room service appropriate? Yes   Fluid consistency: Thin      02/03/21 2358            Hospital Course: Paige Hamilton is a 40 yo female with PMH fibromyalgia, GERD, HLD, PCOS who presented to the hospital with fever and shortness of breath.  Symptoms have been ongoing for approximately 2 days prior to admission with associated sore throat.  She has also been taking care of of her dad at home who also has COVID; she also endorsed recently being at a wedding with possible contacts that were ill as well. On admission she underwent work-up and was found to be positive for COVID-19 as well as positive for influenza A.  She was started on Paxlovid and Tamiflu.  She had mild elevation in creatinine and was started on fluids; the morning following admission, renal function had also returned back to normal. She was discharged with ongoing Paxlovid course and Tamiflu course.   The patient's chronic medical conditions were treated accordingly per the patient's home medication regimen except as noted.  On day of discharge, patient was felt deemed stable for discharge. Patient/family member advised to call PCP or come back to ER if needed.   Principal Diagnosis: Sepsis [facility])  Discharge Diagnoses: Principal Problem:   Sepsis (HCC) Active  Problems:   COVID-19 virus infection   Influenza A   AKI (acute kidney injury) (HCC)   Palpitation   Hypokalemia   Fibromyalgia   GERD (gastroesophageal reflux disease)   Discharge Instructions     Increase activity slowly   Complete by: As directed       Allergies as of 02/04/2021   No Known Allergies      Medication List     STOP taking these medications    albuterol 108 (90 Base) MCG/ACT inhaler Commonly known as: VENTOLIN HFA   benzonatate 200 MG capsule Commonly known as: TESSALON   famotidine 20 MG tablet Commonly known as: PEPCID   HYDROcodone bit-homatropine 5-1.5 MG/5ML syrup Commonly known as: HYCODAN   sucralfate 1 GM/10ML suspension Commonly known as: Carafate       TAKE these medications    acetaminophen 325 MG tablet Commonly known as: TYLENOL Take 2 tablets (650 mg total) by mouth every 4 (four) hours as needed for mild pain, fever, headache or moderate pain.   aerochamber plus with mask inhaler Use as instructed   cyanocobalamin 1000 MCG/ML injection Commonly known as: (VITAMIN B-12) Inject 1,000 mcg into the muscle every 14 (fourteen) days.   DULoxetine 60 MG capsule Commonly known as: CYMBALTA Take 60 mg by mouth 2 (two) times daily.   ferrous sulfate 325 (65 FE) MG tablet Take 325 mg by mouth daily.   fluticasone 50 MCG/ACT nasal spray Commonly known as: FLONASE Place 2 sprays into both nostrils daily. Start taking on:  February 05, 2021   ibuprofen 200 MG tablet Commonly known as: ADVIL Take 2 tablets (400 mg total) by mouth every 8 (eight) hours as needed for fever, headache or mild pain. What changed:  medication strength how much to take reasons to take this   Lysine 500 MG Tabs Take 500 mg by mouth 2 (two) times daily.   magnesium gluconate 500 MG tablet Commonly known as: MAGONATE Take 500 mg by mouth daily.   Mounjaro 2.5 MG/0.5ML Pen Generic drug: tirzepatide Inject 2.5 mg into the skin once a week.    nirmatrelvir/ritonavir EUA 20 x 150 MG & 10 x 100MG  Tabs Commonly known as: PAXLOVID Take 3 tablets by mouth 2 (two) times daily for 5 days. Patient GFR is 60. Take nirmatrelvir (150 mg) two tablets twice daily for 5 days and ritonavir (100 mg) one tablet twice daily for 5 days.   omeprazole 20 MG capsule Commonly known as: PRILOSEC Take 20 mg by mouth daily.   oseltamivir 75 MG capsule Commonly known as: TAMIFLU Take 1 capsule (75 mg total) by mouth every 12 (twelve) hours.   spironolactone 50 MG tablet Commonly known as: ALDACTONE Take 50 mg by mouth daily.   tiZANidine 4 MG tablet Commonly known as: ZANAFLEX Take 4 mg by mouth daily.   Vitamin D 50 MCG (2000 UT) tablet Take 2,000 Units by mouth daily.        Follow-up Information     Zanard, , MD. Schedule an appointment as soon as possible for a visit in 2 days.   Specialty: Family Medicine Contact information: [address] [place] [phone]                No Known Allergies  Consultations:   Discharge Exam: BP (!) 92/56 (BP Location: Left Arm)    Pulse 78    Temp 98.3 F (36.8 C) (Oral)    Resp 18    Ht 5\' 8"  (1.727 m)    Wt 104.3 kg    LMP 01/20/2021    SpO2 100%    BMI 34.96 kg/m  Physical Exam Constitutional:      Appearance: Normal appearance.  HENT:     Mouth/Throat:     Mouth: Mucous membranes are moist.  Eyes:     Extraocular Movements: Extraocular movements intact.  Cardiovascular:     Rate and Rhythm: Normal rate and regular rhythm.  Pulmonary:     Comments: Mild coarse breath sounds bilaterally, no wheezing or significant rhonchi Abdominal:     General: Bowel sounds are normal. There is no distension.     Palpations: Abdomen is soft.     Tenderness: There is no abdominal tenderness.  Musculoskeletal:        General: Normal range of motion.     Cervical back: Normal range of motion and neck supple.  Skin:    General: Skin is warm and dry.   Neurological:     General: No focal deficit present.     Mental Status: She is alert.  Psychiatric:        Mood and Affect: Mood normal.        Behavior: Behavior normal.     The results of significant diagnostics from this hospitalization (including imaging, microbiology, ancillary and laboratory) are listed below for reference.   Microbiology: Recent Results (from the past 240 hour(s))  Resp Panel by RT-PCR (Flu A&B, Covid) Nasopharyngeal Swab     Status: Abnormal  Collection Time: 02/03/21  1:11 AM   Specimen: Nasopharyngeal Swab; Nasopharyngeal(NP) swabs in vial transport medium  Result Value Ref Range Status   SARS Coronavirus 2 by RT PCR POSITIVE (A) NEGATIVE Final    Comment: (NOTE) SARS-CoV-2 target nucleic acids are DETECTED.  The SARS-CoV-2 RNA is generally detectable in upper respiratory specimens during the acute phase of infection. Positive results are indicative of the presence of the identified virus, but do not rule out bacterial infection or co-infection with other pathogens not detected by the test. Clinical correlation with patient history and other diagnostic information is necessary to determine patient infection status. The expected result is Negative.  Fact Sheet for Patients: BloggerCourse.com  Fact Sheet for Healthcare Providers: SeriousBroker.it  This test is not yet approved or cleared by the [place] FDA and  has been authorized for detection and/or diagnosis of SARS-CoV-2 by FDA under an Emergency Use Authorization (EUA).  This EUA will remain in effect (meaning this test can be used) for the duration of  the COVID-19 declaration under Section 564(b)(1) of the A ct, 21 U.S.C. section 360bbb-3(b)(1), unless the authorization is terminated or revoked sooner.     Influenza A by PCR POSITIVE (A) NEGATIVE Final   Influenza B by PCR NEGATIVE NEGATIVE Final    Comment: (NOTE) The Xpert Xpress  SARS-CoV-2/FLU/RSV plus assay is intended as an aid in the diagnosis of influenza from Nasopharyngeal swab specimens and should not be used as a sole basis for treatment. Nasal washings and aspirates are unacceptable for Xpert Xpress SARS-CoV-2/FLU/RSV testing.  Fact Sheet for Patients: BloggerCourse.com  Fact Sheet for Healthcare Providers: SeriousBroker.it  This test is not yet approved or cleared by the [place] FDA and has been authorized for detection and/or diagnosis of SARS-CoV-2 by FDA under an Emergency Use Authorization (EUA). This EUA will remain in effect (meaning this test can be used) for the duration of the COVID-19 declaration under Section 564(b)(1) of the Act, 21 U.S.C. section 360bbb-3(b)(1), unless the authorization is terminated or revoked.  Performed at [facility], [address] [place], [place] [address]   Group A Strep by PCR     Status: None   Collection Time: 02/03/21  3:21 AM   Specimen: Throat; Sterile Swab  Result Value Ref Range Status   Group A Strep by PCR NOT DETECTED NOT DETECTED Final    Comment: Performed at [facility], [address] [place], [place] [address]     Labs: BNP (last 3 results) No results for input(s): BNP in the last 8760 hours. Basic Metabolic Panel: Recent Labs  Lab 02/03/21 0135 02/04/21 0356  NA 137 139  K 3.3* 3.6  CL 106 111  CO2 23 24  GLUCOSE 109* 117*  BUN 18 11  CREATININE 1.12* 0.51  CALCIUM 8.8* 8.1*   Liver Function Tests: Recent Labs  Lab 02/04/21 0356  AST 19  ALT 16  ALKPHOS 47  BILITOT 0.5  PROT 6.2*  ALBUMIN 3.2*   No results for input(s): LIPASE, AMYLASE in the last 168 hours. No results for input(s): AMMONIA in the last 168 hours. CBC: Recent Labs  Lab 02/03/21 0135 02/04/21 0356  WBC 7.8 5.8  NEUTROABS 5.6 3.1  HGB 12.8 10.5*  HCT 39.4 32.1*  MCV 84.9 84.9  PLT 287 227   Cardiac Enzymes: No  results for input(s): CKTOTAL, CKMB, CKMBINDEX, TROPONINI in the last 168 hours. BNP: Invalid input(s): POCBNP CBG: No results for input(s):  GLUCAP in the last 168 hours. D-Dimer Recent Labs    02/03/21 0135  DDIMER 0.29   Hgb A1c No results for input(s): HGBA1C in the last 72 hours. Lipid Profile No results for input(s): CHOL, HDL, LDLCALC, TRIG, CHOLHDL, LDLDIRECT in the last 72 hours. Thyroid function studies No results for input(s): TSH, T4TOTAL, T3FREE, THYROIDAB in the last 72 hours.  Invalid input(s): FREET3 Anemia work up No results for input(s): VITAMINB12, FOLATE, FERRITIN, TIBC, IRON, RETICCTPCT in the last 72 hours. Urinalysis    Component Value Date/Time   COLORURINE YELLOW 10/13/2020 1649   APPEARANCEUR CLOUDY (A) 10/13/2020 1649   LABSPEC 1.030 10/13/2020 1649   PHURINE 5.0 10/13/2020 1649   GLUCOSEU NEGATIVE 10/13/2020 1649   HGBUR LARGE (A) 10/13/2020 1649   BILIRUBINUR SMALL (A) 10/13/2020 1649   BILIRUBINUR Neg 01/15/2013 1541   KETONESUR 15 (A) 10/13/2020 1649   PROTEINUR 30 (A) 10/13/2020 1649   UROBILINOGEN negative 01/15/2013 1541   NITRITE NEGATIVE 10/13/2020 1649   LEUKOCYTESUR TRACE (A) 10/13/2020 1649   Sepsis Labs Invalid input(s): PROCALCITONIN,  WBC,  LACTICIDVEN Microbiology Recent Results (from the past 240 hour(s))  Resp Panel by RT-PCR (Flu A&B, Covid) Nasopharyngeal Swab     Status: Abnormal   Collection Time: 02/03/21  1:11 AM   Specimen: Nasopharyngeal Swab; Nasopharyngeal(NP) swabs in vial transport medium  Result Value Ref Range Status   SARS Coronavirus 2 by RT PCR POSITIVE (A) NEGATIVE Final    Comment: (NOTE) SARS-CoV-2 target nucleic acids are DETECTED.  The SARS-CoV-2 RNA is generally detectable in upper respiratory specimens during the acute phase of infection. Positive results are indicative of the presence of the identified virus, but do not rule out bacterial infection or co-infection with other pathogens  not detected by the test. Clinical correlation with patient history and other diagnostic information is necessary to determine patient infection status. The expected result is Negative.  Fact Sheet for Patients: BloggerCourse.com  Fact Sheet for Healthcare Providers: SeriousBroker.it  This test is not yet approved or cleared by the [place] FDA and  has been authorized for detection and/or diagnosis of SARS-CoV-2 by FDA under an Emergency Use Authorization (EUA).  This EUA will remain in effect (meaning this test can be used) for the duration of  the COVID-19 declaration under Section 564(b)(1) of the A ct, 21 U.S.C. section 360bbb-3(b)(1), unless the authorization is terminated or revoked sooner.     Influenza A by PCR POSITIVE (A) NEGATIVE Final   Influenza B by PCR NEGATIVE NEGATIVE Final    Comment: (NOTE) The Xpert Xpress SARS-CoV-2/FLU/RSV plus assay is intended as an aid in the diagnosis of influenza from Nasopharyngeal swab specimens and should not be used as a sole basis for treatment. Nasal washings and aspirates are unacceptable for Xpert Xpress SARS-CoV-2/FLU/RSV testing.  Fact Sheet for Patients: BloggerCourse.com  Fact Sheet for Healthcare Providers: SeriousBroker.it  This test is not yet approved or cleared by the [place] FDA and has been authorized for detection and/or diagnosis of SARS-CoV-2 by FDA under an Emergency Use Authorization (EUA). This EUA will remain in effect (meaning this test can be used) for the duration of the COVID-19 declaration under Section 564(b)(1) of the Act, 21 U.S.C. section 360bbb-3(b)(1), unless the authorization is terminated or revoked.  Performed at [facility], [address] [place], [place] [address]   Group A Strep by PCR     Status: None   Collection Time: 02/03/21  3:21 AM  Specimen: Throat;  Sterile Swab  Result Value Ref Range Status   Group A Strep by PCR NOT DETECTED NOT DETECTED Final    Comment: Performed at [facility], [address] [place], [place] [address]    Procedures/Studies: DG Chest 2 View  Result Date: 02/03/2021 CLINICAL DATA:  Shortness of breath for 2 days, recent COVID exposure, initial encounter EXAM: CHEST - 2 VIEW COMPARISON:  10/13/2020 FINDINGS: The heart size and mediastinal contours are within normal limits. Both lungs are clear. The visualized skeletal structures are unremarkable. IMPRESSION: No active cardiopulmonary disease. Electronically Signed   By: Alcide Clever M.D.   On: 02/03/2021 02:16   CT Angio Chest PE W and/or Wo Contrast  Result Date: 02/03/2021 CLINICAL DATA:  Concern for pulmonary embolism. EXAM: CT ANGIOGRAPHY CHEST WITH CONTRAST TECHNIQUE: Multidetector CT imaging of the chest was performed using the standard protocol during bolus administration of intravenous contrast. Multiplanar CT image reconstructions and MIPs were obtained to evaluate the vascular anatomy. CONTRAST:  OMNIPAQUE IOHEXOL 350 MG/ML SOLN COMPARISON:  Chest radiograph dated 02/03/2021. FINDINGS: Evaluation of this exam is limited due to respiratory motion artifact. Cardiovascular: There is no cardiomegaly or pericardial effusion. The thoracic aorta is unremarkable. The origins of the great vessels of the aortic arch appear patent. Evaluation of the pulmonary arteries is limited due to respiratory motion artifact and suboptimal opacification and timing of the contrast. No definite pulmonary artery embolus identified. Mediastinum/Nodes: No hilar or mediastinal adenopathy. The esophagus is grossly unremarkable. No mediastinal fluid collection. Lungs/Pleura: The lungs are clear. There is no pleural effusion or pneumothorax. The central airways are patent. Upper Abdomen: Fatty liver. Cholecystectomy. Postsurgical changes of the stomach. Musculoskeletal: Mild  degenerative changes. No acute osseous pathology. Review of the MIP images confirms the above findings. IMPRESSION: 1. No acute intrathoracic pathology. No CT evidence of pulmonary embolism. 2. Fatty liver. Electronically Signed   By: Elgie Collard M.D.   On: 02/03/2021 03:20   US Venous Img Lower Unilateral Left  Result Date: 01/06/2021 CLINICAL DATA:  Pain and swelling left lower extremity EXAM: Left LOWER EXTREMITY VENOUS DOPPLER ULTRASOUND TECHNIQUE: Gray-scale sonography with compression, as well as color and duplex ultrasound, were performed to evaluate the deep venous system(s) from the level of the common femoral vein through the popliteal and proximal calf veins. COMPARISON:  None. FINDINGS: VENOUS Normal compressibility of the common femoral, superficial femoral, and popliteal veins, as well as the visualized calf veins. Visualized portions of profunda femoral vein and great saphenous vein unremarkable. No filling defects to suggest DVT on grayscale or color Doppler imaging. Doppler waveforms show normal direction of venous flow, normal respiratory plasticity and response to augmentation. Limited views of the contralateral common femoral vein are unremarkable. OTHER Technologist examined the area of patient's symptom of spider bite in the left calf. There is no definite demonstrable loculated fluid collection. There is acoustic shadowing posterior to the region of the spider bite. Limitations: none IMPRESSION: There is no evidence of deep venous thrombosis in the left lower extremity. Electronically Signed   By: Ernie Avena M.D.   On: 01/06/2021 16:45     Time coordinating discharge: Over 30 minutes    Lewie Chamber, MD  [facility] 02/04/2021, 11:14 AM

## 2021-02-04 NOTE — Progress Notes (Signed)
Patient has been explained and taught discharge instructions. Patient has no further questions at this time. IV has been removed and site is clean, dry, and intact. Patient taken by wheelchair to sister's car at the front entrance.  Sinclair Ship, RN

## 2021-02-04 NOTE — TOC CM/SW Note (Signed)
°  Transition of Care [facility]) Screening Note   Patient Details  Name: Paige Hamilton Date of Birth: September 03, 1980   Transition of Care [facility]) CM/SW Contact:    Darleene Cleaver, LCSW Phone Number: 02/04/2021, 10:44 AM    Transition of Care Department [facility]) has reviewed patient and no TOC needs have been identified at this time. We will continue to monitor patient advancement through interdisciplinary progression rounds. If new patient transition needs arise, please place a TOC consult.

## 2021-02-04 NOTE — Hospital Course (Signed)
Paige Hamilton is a 40 yo female with PMH fibromyalgia, GERD, HLD, PCOS who presented to the hospital with fever and shortness of breath.  Symptoms have been ongoing for approximately 2 days prior to admission with associated sore throat.  She has also been taking care of of her dad at home who also has COVID; she also endorsed recently being at a wedding with possible contacts that were ill as well. On admission she underwent work-up and was found to be positive for COVID-19 as well as positive for influenza A.  She was started on Paxlovid and Tamiflu.  She had mild elevation in creatinine and was started on fluids; the morning following admission, renal function had also returned back to normal. She was discharged with ongoing Paxlovid course and Tamiflu course.

## 2021-02-05 ENCOUNTER — Other Ambulatory Visit: Payer: Self-pay | Admitting: Internal Medicine

## 2021-02-05 MED ORDER — HYDROCODONE BIT-HOMATROP MBR 5-1.5 MG/5ML PO SOLN: 10.0000 mL | Freq: Four times a day (QID) | ORAL | 0 refills | Status: DC | PRN

## 2021-02-12 ENCOUNTER — Emergency Department (HOSPITAL_BASED_OUTPATIENT_CLINIC_OR_DEPARTMENT_OTHER): Payer: BLUE CROSS/BLUE SHIELD

## 2021-02-12 ENCOUNTER — Encounter (HOSPITAL_BASED_OUTPATIENT_CLINIC_OR_DEPARTMENT_OTHER): Payer: Self-pay

## 2021-02-12 ENCOUNTER — Other Ambulatory Visit: Payer: Self-pay

## 2021-02-12 ENCOUNTER — Emergency Department (HOSPITAL_BASED_OUTPATIENT_CLINIC_OR_DEPARTMENT_OTHER)
Admission: EM | Admit: 2021-02-12 | Discharge: 2021-02-12 | Disposition: A | Discharge status: Home / Self Care | Payer: BLUE CROSS/BLUE SHIELD | Attending: Emergency Medicine | Admitting: Emergency Medicine

## 2021-02-12 DIAGNOSIS — M79605 Pain in left leg: Secondary | ICD-10-CM | POA: Insufficient documentation

## 2021-02-12 DIAGNOSIS — R112 Nausea with vomiting, unspecified: Secondary | ICD-10-CM | POA: Diagnosis not present

## 2021-02-12 DIAGNOSIS — Z8616 Personal history of COVID-19: Secondary | ICD-10-CM | POA: Diagnosis not present

## 2021-02-12 DIAGNOSIS — R5381 Other malaise: Secondary | ICD-10-CM | POA: Diagnosis not present

## 2021-02-12 DIAGNOSIS — R630 Anorexia: Secondary | ICD-10-CM | POA: Insufficient documentation

## 2021-02-12 DIAGNOSIS — R059 Cough, unspecified: Secondary | ICD-10-CM | POA: Diagnosis not present

## 2021-02-12 DIAGNOSIS — M79604 Pain in right leg: Secondary | ICD-10-CM | POA: Insufficient documentation

## 2021-02-12 DIAGNOSIS — Z79899 Other long term (current) drug therapy: Secondary | ICD-10-CM | POA: Insufficient documentation

## 2021-02-12 DIAGNOSIS — R079 Chest pain, unspecified: Secondary | ICD-10-CM

## 2021-02-12 DIAGNOSIS — R5383 Other fatigue: Secondary | ICD-10-CM | POA: Diagnosis not present

## 2021-02-12 DIAGNOSIS — R0789 Other chest pain: Secondary | ICD-10-CM | POA: Diagnosis present

## 2021-02-12 LAB — CBC
HCT: 38.1 % (ref 36.0–46.0)
Hemoglobin: 12.7 g/dL (ref 12.0–15.0)
MCH: 27.5 pg (ref 26.0–34.0)
MCHC: 33.3 g/dL (ref 30.0–36.0)
MCV: 82.5 fL (ref 80.0–100.0)
Platelets: 441 10*3/uL — ABNORMAL HIGH (ref 150–400)
RBC: 4.62 MIL/uL (ref 3.87–5.11)
RDW: 13.2 % (ref 11.5–15.5)
WBC: 6.2 10*3/uL (ref 4.0–10.5)
nRBC: 0 % (ref 0.0–0.2)

## 2021-02-12 LAB — BASIC METABOLIC PANEL
Anion gap: 11 (ref 5–15)
BUN: 10 mg/dL (ref 6–20)
CO2: 22 mmol/L (ref 22–32)
Calcium: 8.7 mg/dL — ABNORMAL LOW (ref 8.9–10.3)
Chloride: 101 mmol/L (ref 98–111)
Creatinine, Ser: 0.46 mg/dL (ref 0.44–1.00)
GFR, Estimated: >60 mL/min (ref >60)
Glucose, Bld: 109 mg/dL — ABNORMAL HIGH (ref 70–99)
Potassium: 3.9 mmol/L (ref 3.5–5.1)
Sodium: 134 mmol/L — ABNORMAL LOW (ref 135–145)

## 2021-02-12 LAB — TROPONIN I (HIGH SENSITIVITY)
Troponin I (High Sensitivity): <2 ng/L (ref <18)
Troponin I (High Sensitivity): 2 ng/L (ref <18)

## 2021-02-12 LAB — D-DIMER, QUANTITATIVE: D-Dimer, Quant: 0.28 ug/mL-FEU (ref 0.00–0.50)

## 2021-02-12 MED ORDER — SODIUM CHLORIDE 0.9 % IV BOLUS
1000.0000 mL | Freq: Once | INTRAVENOUS | Status: AC
  Administered 2021-02-12: 1000 mL via INTRAVENOUS

## 2021-02-12 MED ORDER — ONDANSETRON HCL 4 MG/2ML IJ SOLN
4.0000 mg | Freq: Once | INTRAMUSCULAR | Status: AC
  Administered 2021-02-12: 4 mg via INTRAVENOUS
  Filled 2021-02-12: qty 2

## 2021-02-12 MED ORDER — ONDANSETRON 4 MG PO TBDP: 4.0000 mg | ORAL_TABLET | Freq: Once | ORAL | Status: DC

## 2021-02-12 MED ORDER — ONDANSETRON HCL 4 MG PO TABS: 4.0000 mg | ORAL_TABLET | Freq: Four times a day (QID) | ORAL | 0 refills | Status: AC

## 2021-02-12 NOTE — ED Provider Notes (Signed)
[facility] DEPART[facility] Provider Note   CSN: [id number] Arrival date & time: 02/12/21  1300     History  Chief Complaint  Patient presents with   Chest Pain    Paige Hamilton is a 41 y.o. female with a past medical history significant for fibromyalgia, depression, recent flu, and COVID infection who presents with chest pain that began last night, went away, and then returned today at around noon.  Patient reports that the chest pain is a heavy pressure on her chest, describes some radiation to the neck, and arm.  Patient reports that nothing makes the pain any better or worse, she does not notice any difference with movement.  Patient does report some worsening with cough.  Patient also endorses that she has had decreased appetite, nausea, and has not been able to tolerate much food or fluids for the last several days.  Patient believes that she may be dehydrated.  Patient does endorse some leg pain bilaterally, but reports a history of restless leg syndrome, reports that this is not significantly different than her normal ROS pain.  Patient has not had any recent travel, is not taking any hormonal supplements.  Patient does describe a cough, with some blood in cough occasionally.  Patient denies history of GI bleed, or any active bleeding at this time.  Patient denies diarrhea, blood in stool.  Patient denies dysuria, urinary frequency, vaginal discharge, vaginal bleeding.   Chest Pain Associated symptoms: nausea       Home Medications Prior to Admission medications   Medication Sig Start Date End Date Taking? Authorizing Provider  ondansetron (ZOFRAN) 4 MG tablet Take 1 tablet (4 mg total) by mouth every 6 (six) hours. 02/12/21  Yes Runa Whittingham H, PA-C  acetaminophen (TYLENOL) 325 MG tablet Take 2 tablets (650 mg total) by mouth every 4 (four) hours as needed for mild pain, fever, headache or moderate pain. 02/04/21   Dwyane Dee, MD  Cholecalciferol (VITAMIN  D) 50 MCG (2000 UT) tablet Take 2,000 Units by mouth daily.    [provider]  cyanocobalamin (,VITAMIN B-12,) 1000 MCG/ML injection Inject 1,000 mcg into the muscle every 14 (fourteen) days.    [provider]  DULoxetine (CYMBALTA) 60 MG capsule Take 60 mg by mouth 2 (two) times daily.    [provider]  ferrous sulfate 325 (65 FE) MG tablet Take 325 mg by mouth daily.    [provider]  fluticasone (FLONASE) 50 MCG/ACT nasal spray Place 2 sprays into both nostrils daily. 02/05/21   Dwyane Dee, MD  HYDROcodone bit-homatropine (HYCODAN) 5-1.5 MG/5ML syrup Take 10 mLs by mouth every 6 (six) hours as needed for cough. 02/05/21   Dwyane Dee, MD  ibuprofen (ADVIL) 200 MG tablet Take 2 tablets (400 mg total) by mouth every 8 (eight) hours as needed for fever, headache or mild pain. 02/04/21   Dwyane Dee, MD  Lysine 500 MG TABS Take 500 mg by mouth 2 (two) times daily.    [provider]  magnesium gluconate (MAGONATE) 500 MG tablet Take 500 mg by mouth daily.    [provider]  omeprazole (PRILOSEC) 20 MG capsule Take 20 mg by mouth daily.    [provider]  oseltamivir (TAMIFLU) 75 MG capsule Take 1 capsule (75 mg total) by mouth every 12 (twelve) hours. 02/03/21   Ezequiel Essex, MD  Spacer/Aero-Holding Chambers (AEROCHAMBER PLUS WITH MASK) inhaler Use as instructed 10/13/20   Charlesetta Shanks, MD  spironolactone (ALDACTONE)  50 MG tablet Take 50 mg by mouth daily. 11/20/20   [provider]  tirzepatide Darcel Bayley) 2.5 MG/0.5ML Pen Inject 2.5 mg into the skin once a week. 01/03/21   [provider]  tiZANidine (ZANAFLEX) 4 MG tablet Take 4 mg by mouth daily.    [provider]  dicyclomine (BENTYL) 20 MG tablet Take 1 tablet (20 mg total) by mouth 2 (two) times daily. 07/22/18 07/19/20  Gareth Morgan, MD  pantoprazole (PROTONIX) 20 MG tablet Take 1 tablet (20 mg total) by mouth 2 (two) times daily.  10/21/17 07/19/20  Mortis, Alvie Heidelberg I, PA-C  prochlorperazine (COMPAZINE) 10 MG tablet Take 1 tablet (10 mg total) by mouth 2 (two) times daily as needed for nausea. 12/17/18 07/19/20  Maudie Flakes, MD      Allergies    Patient has no known allergies.    Review of Systems   Review of Systems  Cardiovascular:  Positive for chest pain.  Gastrointestinal:  Positive for nausea.  All other systems reviewed and are negative.  Physical Exam Updated Vital Signs BP 128/80    Pulse 77    Temp 98.1 F (36.7 C) (Oral)    Resp 19    Ht 5\' 8"  (1.727 m)    Wt 104.8 kg    LMP 01/27/2021    SpO2 100%    BMI 35.12 kg/m  Physical Exam Vitals and nursing note reviewed.  Constitutional:      General: She is not in acute distress.    Appearance: Normal appearance.  HENT:     Head: Normocephalic and atraumatic.     Mouth/Throat:     Comments: Mouth is clear at this time, tongue and cheeks are slightly dry Eyes:     General:        Right eye: No discharge.        Left eye: No discharge.  Cardiovascular:     Rate and Rhythm: Normal rate and regular rhythm.     Heart sounds: No murmur heard.   No friction rub. No gallop.     Comments: No tenderness to palpation chest wall Pulmonary:     Effort: Pulmonary effort is normal.     Breath sounds: Normal breath sounds.  Abdominal:     General: Bowel sounds are normal.     Palpations: Abdomen is soft.  Skin:    General: Skin is warm and dry.     Capillary Refill: Capillary refill takes less than 2 seconds.  Neurological:     Mental Status: She is alert and oriented to person, place, and time.  Psychiatric:        Mood and Affect: Mood normal.        Behavior: Behavior normal.    ED Results / Procedures / Treatments   Labs (all labs ordered are listed, but only abnormal results are displayed) Labs Reviewed  BASIC METABOLIC PANEL - Abnormal; Notable for the following components:      Result Value   Sodium 134 (*)    Glucose, Bld 109 (*)     Calcium 8.7 (*)    All other components within normal limits  CBC - Abnormal; Notable for the following components:   Platelets 441 (*)    All other components within normal limits  D-DIMER, QUANTITATIVE  PREGNANCY, URINE  TROPONIN I (HIGH SENSITIVITY)  TROPONIN I (HIGH SENSITIVITY)    EKG None  Radiology DG Chest 2 View  Result Date: 02/12/2021 CLINICAL DATA:  Chest pain. EXAM:  CHEST - 2 VIEW COMPARISON:  February 03, 2021. FINDINGS: The heart size and mediastinal contours are within normal limits. Hypoinflation of the lungs with minimal bibasilar subsegmental atelectasis. The visualized skeletal structures are unremarkable. IMPRESSION: Hypoinflation of the lungs with minimal bibasilar subsegmental atelectasis. Electronically Signed   By: Marijo Conception M.D.   On: 02/12/2021 13:42    Procedures Procedures    Medications Ordered in ED Medications  ondansetron (ZOFRAN) injection 4 mg (4 mg Intravenous Given 02/12/21 1621)  sodium chloride 0.9 % bolus 1,000 mL (0 mLs Intravenous Stopped 02/12/21 1715)    ED Course/ Medical Decision Making/ A&P                           Medical Decision Making  Co morbidities that complicate the patient evaluation  Fibromyalgia, depression  Lab Tests:  I Ordered, and personally interpreted labs.  The pertinent results include: EKG with nonspecific T wave changes, negative troponin x2, unremarkable chest x-ray with some hypoinflation.  Mild hyponatremia, mild hyperglycemia.  Unremarkable CBC.  D-dimer 0.28.   Imaging Studies ordered:  I ordered imaging studies including chest x-ray I independently visualized and interpreted imaging which showed hypoinflation, some bilateral bibasilar atelectasis. I agree with the radiologist interpretation   Cardiac Monitoring:  The patient was maintained on a cardiac monitor.  I personally viewed and interpreted the cardiac monitored which showed an underlying rhythm of: Sinus  rhythm   Reevaluation:  After fluid bolus, Zofran I reevaluated the patient and found that they have :improved   After consideration of the diagnostic results and the patients response to treatment, I feel that the patent would benefit from discharge at this time with strict return precautions.  Given the large differential diagnosis for Raguel Osher, the decision making in this case is of high complexity.  After evaluating all of the data points in this case, the presentation of Milaun Beams is NOT consistent with Acute Coronary Syndrome (ACS) and/or myocardial ischemia, pulmonary embolism, aortic dissection; Borhaave's, significant arrythmia, pneumothorax, cardiac tamponade, or other emergent cardiopulmonary condition.  Further, the presentation of Ary Helman is NOT consistent with pericarditis, myocarditis, cholecystitis, pancreatitis, mediastinitis, endocarditis, new valvular disease.  Additionally, the presentation of Porshia Leatherman is NOT consistent with flail chest, cardiac contusion, ARDS, or significant intra-thoracic or intra-abdominal bleeding.  Moreover, this presentation is NOT consistent with pneumonia, sepsis, or pyelonephritis.  The patient has a heart score of 3.  Negative troponin x2.  Patient with some nausea, vomiting, generalized malaise, fatigue which I will seem consistent with recent diagnosis of COVID, and flu few days ago.  We will provide prescription for Zofran, encourage plenty of fluids, rest.  D-dimer negative at this time, patient without any other risk factors for clot or pulmonary embolism, high clinical suspicion that patient does not have these conditions at this time.  Strict return and follow-up precautions have been given by me personally or by detailed written instruction given verbally by nursing staff using the teach back method to the patient/family/caregiver(s).  Data Reviewed/Counseling: I have reviewed the patient's vital signs, nursing  notes, and other relevant tests/information. I had a detailed discussion regarding the historical points, exam findings, and any diagnostic results supporting the discharge diagnosis. I also discussed the need for outpatient follow-up and the need to return to the ED if symptoms worsen or if there are any questions or concerns that arise at home.   Final Clinical Impression(s) / ED Diagnoses Final  diagnoses:  Chest pain, unspecified type    Rx / DC Orders ED Discharge Orders          Ordered    ondansetron (ZOFRAN) 4 MG tablet  Every 6 hours        02/12/21 1703              Dorien Chihuahua 02/12/21 1727    Margette Fast, MD 02/18/21 1755

## 2021-02-12 NOTE — Discharge Instructions (Signed)
As we discussed I did not see anything worrisome on your exam today. I encourage plenty of fluids, you can use the nausea medication as needed to help with appetite. Continue to rest, use symptomatic over the counter medications as needed for lingering symptoms of covid / flu. Please return if your chest pain worsens or fails to improve. I encourage you to follow up with your primary care doctor to check in and make sure you continue to improve over the next week or two.  It was a pleasure taking care of you today, I hope you feel better soon.

## 2021-02-12 NOTE — ED Triage Notes (Addendum)
Pt c/o CP started last night-pt +covid/+flu 12/24-NAD-steady gait

## 2021-02-12 NOTE — ED Notes (Signed)
Extra blue top tube sent to lab

## 2022-02-11 ENCOUNTER — Emergency Department (HOSPITAL_BASED_OUTPATIENT_CLINIC_OR_DEPARTMENT_OTHER): Payer: BLUE CROSS/BLUE SHIELD

## 2022-02-11 ENCOUNTER — Other Ambulatory Visit: Payer: Self-pay

## 2022-02-11 ENCOUNTER — Emergency Department (HOSPITAL_BASED_OUTPATIENT_CLINIC_OR_DEPARTMENT_OTHER)
Admission: EM | Admit: 2022-02-11 | Discharge: 2022-02-11 | Disposition: A | Discharge status: Home / Self Care | Payer: BLUE CROSS/BLUE SHIELD | Attending: Emergency Medicine | Admitting: Emergency Medicine

## 2022-02-11 ENCOUNTER — Encounter (HOSPITAL_BASED_OUTPATIENT_CLINIC_OR_DEPARTMENT_OTHER): Payer: Self-pay | Admitting: Pediatrics

## 2022-02-11 DIAGNOSIS — U071 COVID-19: Secondary | ICD-10-CM | POA: Diagnosis not present

## 2022-02-11 DIAGNOSIS — Z79899 Other long term (current) drug therapy: Secondary | ICD-10-CM | POA: Diagnosis not present

## 2022-02-11 DIAGNOSIS — R2243 Localized swelling, mass and lump, lower limb, bilateral: Secondary | ICD-10-CM | POA: Diagnosis not present

## 2022-02-11 DIAGNOSIS — R509 Fever, unspecified: Secondary | ICD-10-CM | POA: Diagnosis present

## 2022-02-11 LAB — RESP PANEL BY RT-PCR (RSV, FLU A&B, COVID)  RVPGX2
Influenza A by PCR: NEGATIVE
Influenza B by PCR: NEGATIVE
Resp Syncytial Virus by PCR: NEGATIVE
SARS Coronavirus 2 by RT PCR: POSITIVE — AB

## 2022-02-11 MED ORDER — HYDROCODONE BIT-HOMATROP MBR 5-1.5 MG/5ML PO SOLN: 5.0000 mL | Freq: Four times a day (QID) | ORAL | 0 refills | Status: DC | PRN

## 2022-02-11 MED ORDER — NIRMATRELVIR/RITONAVIR (PAXLOVID)TABLET: 3.0000 | ORAL_TABLET | Freq: Two times a day (BID) | ORAL | 0 refills | Status: DC

## 2022-02-11 NOTE — ED Provider Notes (Signed)
[facility] EMERGENCY DEPARTMENT Provider Note   CSN: [id number] Arrival date & time: 02/11/22  1534     History  Chief Complaint  Patient presents with   Fever   Leg Swelling    Paige Hamilton is a 42 y.o. female.  42 year old female who presents emergency department with flulike symptoms x 2 days.  Patient recently spent 2 weeks in [place].  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  She began having bodyaches chills, cough, myalgias and leg pain left greater than right beginning 2 days ago.  She has been taking over-the-counter cough medication such as DayQuil and NyQuil without relief of her symptoms or ability to sleep well.  She denies nausea vomiting shortness of breath or chest pain.   The history is provided by the patient. No language interpreter was used.  Fever      Home Medications Prior to Admission medications   Medication Sig Start Date End Date Taking? Authorizing Provider  acetaminophen (TYLENOL) 325 MG tablet Take 2 tablets (650 mg total) by mouth every 4 (four) hours as needed for mild pain, fever, headache or moderate pain. 02/04/21   Dwyane Dee, MD  Cholecalciferol (VITAMIN D) 50 MCG (2000 UT) tablet Take 2,000 Units by mouth daily.    [provider]  cyanocobalamin (,VITAMIN B-12,) 1000 MCG/ML injection Inject 1,000 mcg into the muscle every 14 (fourteen) days.    [provider]  DULoxetine (CYMBALTA) 60 MG capsule Take 60 mg by mouth 2 (two) times daily.    [provider]  ferrous sulfate 325 (65 FE) MG tablet Take 325 mg by mouth daily.    [provider]  fluticasone (FLONASE) 50 MCG/ACT nasal spray Place 2 sprays into both nostrils daily. 02/05/21   Dwyane Dee, MD  HYDROcodone bit-homatropine (HYCODAN) 5-1.5 MG/5ML syrup Take 10 mLs by mouth every 6 (six) hours as needed for cough. 02/05/21   Dwyane Dee, MD  ibuprofen (ADVIL) 200 MG tablet Take 2  tablets (400 mg total) by mouth every 8 (eight) hours as needed for fever, headache or mild pain. 02/04/21   Dwyane Dee, MD  Lysine 500 MG TABS Take 500 mg by mouth 2 (two) times daily.    [provider]  magnesium gluconate (MAGONATE) 500 MG tablet Take 500 mg by mouth daily.    [provider]  omeprazole (PRILOSEC) 20 MG capsule Take 20 mg by mouth daily.    [provider]  ondansetron (ZOFRAN) 4 MG tablet Take 1 tablet (4 mg total) by mouth every 6 (six) hours. 02/12/21   Prosperi, Christian H, PA-C  oseltamivir (TAMIFLU) 75 MG capsule Take 1 capsule (75 mg total) by mouth every 12 (twelve) hours. 02/03/21   Ezequiel Essex, MD  Spacer/Aero-Holding Chambers (AEROCHAMBER PLUS WITH MASK) inhaler Use as instructed 10/13/20   Charlesetta Shanks, MD  spironolactone (ALDACTONE) 50 MG tablet Take 50 mg by mouth daily. 11/20/20   [provider]  tirzepatide Darcel Bayley) 2.5 MG/0.5ML Pen Inject 2.5 mg into the skin once a week. 01/03/21   [provider]  tiZANidine (ZANAFLEX) 4 MG tablet Take 4 mg by mouth daily.    [provider]  dicyclomine (BENTYL) 20 MG tablet Take 1 tablet (20 mg total) by mouth 2 (two) times daily. 07/22/18 07/19/20  Gareth Morgan, MD  pantoprazole (PROTONIX) 20 MG tablet Take 1 tablet (20 mg total) by mouth 2 (two) times daily. 10/21/17 07/19/20  Mortis, Alvie Heidelberg  I, PA-C  prochlorperazine (COMPAZINE) 10 MG tablet Take 1 tablet (10 mg total) by mouth 2 (two) times daily as needed for nausea. 12/17/18 07/19/20  Sabas Sous, MD      Allergies    Patient has no known allergies.    Review of Systems   Review of Systems  Constitutional:  Positive for fever.    Physical Exam Updated Vital Signs BP 112/70 (BP Location: Right Arm)   Pulse 79   Temp 98.1 F (36.7 C) (Oral)   Resp 20   Ht 5\' 8"  (1.727 m)   Wt 93 kg   LMP 01/14/2022   SpO2 100%   BMI 31.17 kg/m  Physical Exam Vitals and nursing note reviewed.   Constitutional:      General: She is not in acute distress.    Appearance: She is well-developed. She is ill-appearing. She is not toxic-appearing or diaphoretic.  HENT:     Head: Normocephalic and atraumatic.     Right Ear: External ear normal.     Left Ear: External ear normal.     Nose: Nose normal.     Mouth/Throat:     Mouth: Mucous membranes are moist.  Eyes:     General: No scleral icterus.    Conjunctiva/sclera: Conjunctivae normal.  Cardiovascular:     Rate and Rhythm: Normal rate and regular rhythm.     Heart sounds: Normal heart sounds. No murmur heard.    No friction rub. No gallop.  Pulmonary:     Effort: Pulmonary effort is normal. No respiratory distress.     Breath sounds: Normal breath sounds.  Abdominal:     General: Bowel sounds are normal. There is no distension.     Palpations: Abdomen is soft. There is no mass.     Tenderness: There is no abdominal tenderness. There is no guarding.  Musculoskeletal:     Cervical back: Normal range of motion.     Right lower leg: No edema.     Left lower leg: No edema.  Skin:    General: Skin is warm and dry.  Neurological:     Mental Status: She is alert and oriented to person, place, and time.  Psychiatric:        Behavior: Behavior normal.     ED Results / Procedures / Treatments   Labs (all labs ordered are listed, but only abnormal results are displayed) Labs Reviewed  RESP PANEL BY RT-PCR (RSV, FLU A&B, COVID)  [id number] - Abnormal; Notable for the following components:      Result Value   SARS Coronavirus 2 by RT PCR POSITIVE (*)    All other components within normal limits    EKG None  Radiology No results found.  Procedures Procedures    Medications Ordered in ED Medications - No data to display  ED Course/ Medical Decision Making/ A&P Clinical Course as of 02/11/22 1829  Mon Feb 11, 2022  1824 SARS Coronavirus 2 by RT PCR(!): POSITIVE [AH]  1824 Feb 13, 2022 Venous Img Lower Unilateral  Left Visualized and interpreted left unilateral lower extremity DVT study which shows no findings of DVT. [AH]    Clinical Course User Index [AH] [place], PA-C                           Medical Decision Making Patient here with flulike symptoms.  Upon review of labs it appears she is positive for COVID-19 infection.  I doubt  any high risk for exotic infection such as MERS virus although she did just return from the [place].  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  I visualized and interpreted a left venous ultrasound which shows no evidence of DVT.  Will discharge with Hycodan cough syrup and Paxlovid.  Discussed outpatient follow-up and return precautions.  Appears appropriate for discharge at this time.  PDMP not reviewed this encounter.   Amount and/or Complexity of Data Reviewed Labs:  Decision-making details documented in ED Course. Radiology:  Decision-making details documented in ED Course.  Risk Prescription drug management.           Final Clinical Impression(s) / ED Diagnoses Final diagnoses:  None    Rx / DC Orders ED Discharge Orders     None         Margarita Mail, PA-C 02/11/22 2015    Lajean Saver, MD 02/15/22 [phone]

## 2022-02-11 NOTE — ED Triage Notes (Signed)
Reported recently travelled from [place]; c/o fever and URI symptoms; concern for left leg swelling as well; reports flight was [place] 18+hrs

## 2022-02-11 NOTE — Discharge Instructions (Signed)
Get help right away if: You have trouble breathing. You have pain or pressure in your chest. You are confused. You have bluish lips and fingernails. You have trouble waking from sleep. You have symptoms that get worse. These symptoms may be an emergency. Get help right away. Call 911. Do not wait to see if the symptoms will go away. Do not drive yourself to the hospital. 

## 2022-02-12 ENCOUNTER — Telehealth (HOSPITAL_COMMUNITY): Payer: Self-pay | Admitting: Student

## 2022-02-12 DIAGNOSIS — U071 COVID-19: Secondary | ICD-10-CM

## 2022-02-12 MED ORDER — NIRMATRELVIR/RITONAVIR (PAXLOVID)TABLET: 3.0000 | ORAL_TABLET | Freq: Two times a day (BID) | ORAL | 0 refills | Status: AC

## 2022-02-12 MED ORDER — HYDROCODONE BIT-HOMATROP MBR 5-1.5 MG/5ML PO SOLN: 5.0000 mL | Freq: Four times a day (QID) | ORAL | 0 refills | Status: AC | PRN

## 2022-02-12 NOTE — Telephone Encounter (Signed)
Patient seen at [facility] last night, medication was sent to her pharmacy but does not currently have them.  Request sent to different pharmacy which I have done.

## 2022-03-26 ENCOUNTER — Emergency Department (HOSPITAL_BASED_OUTPATIENT_CLINIC_OR_DEPARTMENT_OTHER): Payer: BLUE CROSS/BLUE SHIELD

## 2022-03-26 ENCOUNTER — Other Ambulatory Visit: Payer: Self-pay

## 2022-03-26 ENCOUNTER — Emergency Department (HOSPITAL_BASED_OUTPATIENT_CLINIC_OR_DEPARTMENT_OTHER)
Admission: EM | Admit: 2022-03-26 | Discharge: 2022-03-26 | Disposition: A | Discharge status: Home / Self Care | Payer: BLUE CROSS/BLUE SHIELD | Attending: Emergency Medicine | Admitting: Emergency Medicine

## 2022-03-26 DIAGNOSIS — K529 Noninfective gastroenteritis and colitis, unspecified: Secondary | ICD-10-CM | POA: Diagnosis not present

## 2022-03-26 DIAGNOSIS — R1084 Generalized abdominal pain: Secondary | ICD-10-CM | POA: Diagnosis present

## 2022-03-26 DIAGNOSIS — Z1152 Encounter for screening for COVID-19: Secondary | ICD-10-CM | POA: Insufficient documentation

## 2022-03-26 LAB — CBC WITH DIFFERENTIAL/PLATELET
Abs Immature Granulocytes: 0.02 10*3/uL (ref 0.00–0.07)
Basophils Absolute: 0.1 10*3/uL (ref 0.0–0.1)
Basophils Relative: 1 %
Eosinophils Absolute: 0.1 10*3/uL (ref 0.0–0.5)
Eosinophils Relative: 2 %
HCT: 36.5 % (ref 36.0–46.0)
Hemoglobin: 11.7 g/dL — ABNORMAL LOW (ref 12.0–15.0)
Immature Granulocytes: 0 %
Lymphocytes Relative: 43 %
Lymphs Abs: 2.9 10*3/uL (ref 0.7–4.0)
MCH: 26.1 pg (ref 26.0–34.0)
MCHC: 32.1 g/dL (ref 30.0–36.0)
MCV: 81.3 fL (ref 80.0–100.0)
Monocytes Absolute: 0.4 10*3/uL (ref 0.1–1.0)
Monocytes Relative: 6 %
Neutro Abs: 3.2 10*3/uL (ref 1.7–7.7)
Neutrophils Relative %: 48 %
Platelets: 289 10*3/uL (ref 150–400)
RBC: 4.49 MIL/uL (ref 3.87–5.11)
RDW: 15.1 % (ref 11.5–15.5)
WBC: 6.7 10*3/uL (ref 4.0–10.5)
nRBC: 0 % (ref 0.0–0.2)

## 2022-03-26 LAB — BASIC METABOLIC PANEL
Anion gap: 6 (ref 5–15)
BUN: 20 mg/dL (ref 6–20)
CO2: 25 mmol/L (ref 22–32)
Calcium: 8.1 mg/dL — ABNORMAL LOW (ref 8.9–10.3)
Chloride: 105 mmol/L (ref 98–111)
Creatinine, Ser: 0.54 mg/dL (ref 0.44–1.00)
GFR, Estimated: >60 mL/min (ref >60)
Glucose, Bld: 102 mg/dL — ABNORMAL HIGH (ref 70–99)
Potassium: 3.4 mmol/L — ABNORMAL LOW (ref 3.5–5.1)
Sodium: 136 mmol/L (ref 135–145)

## 2022-03-26 LAB — URINALYSIS, MICROSCOPIC (REFLEX)

## 2022-03-26 LAB — URINALYSIS, ROUTINE W REFLEX MICROSCOPIC
Glucose, UA: NEGATIVE mg/dL
Hgb urine dipstick: NEGATIVE
Ketones, ur: NEGATIVE mg/dL
Nitrite: NEGATIVE
Protein, ur: 30 mg/dL — AB
Specific Gravity, Urine: >=1.03 (ref 1.005–1.030)
pH: 5.5 (ref 5.0–8.0)

## 2022-03-26 LAB — RESP PANEL BY RT-PCR (RSV, FLU A&B, COVID)  RVPGX2
Influenza A by PCR: NEGATIVE
Influenza B by PCR: NEGATIVE
Resp Syncytial Virus by PCR: NEGATIVE
SARS Coronavirus 2 by RT PCR: NEGATIVE

## 2022-03-26 LAB — PREGNANCY, URINE: Preg Test, Ur: NEGATIVE

## 2022-03-26 MED ORDER — SODIUM CHLORIDE 0.9 % IV BOLUS
500.0000 mL | Freq: Once | INTRAVENOUS | Status: AC
  Administered 2022-03-26: 500 mL via INTRAVENOUS

## 2022-03-26 MED ORDER — AMOXICILLIN-POT CLAVULANATE 875-125 MG PO TABS: 1.0000 | ORAL_TABLET | Freq: Two times a day (BID) | ORAL | 0 refills | Status: AC

## 2022-03-26 MED ORDER — KETOROLAC TROMETHAMINE 15 MG/ML IJ SOLN: 15.0000 mg | Freq: Once | INTRAMUSCULAR | Status: DC

## 2022-03-26 MED ORDER — KETOROLAC TROMETHAMINE 15 MG/ML IJ SOLN
15.0000 mg | Freq: Once | INTRAMUSCULAR | Status: AC
  Administered 2022-03-26: 15 mg via INTRAVENOUS
  Filled 2022-03-26: qty 1

## 2022-03-26 MED ORDER — ONDANSETRON 4 MG PO TBDP: 4.0000 mg | ORAL_TABLET | Freq: Three times a day (TID) | ORAL | 0 refills | Status: AC | PRN

## 2022-03-26 NOTE — Discharge Instructions (Addendum)
As we discussed, CT imaging revealed the you have some inflammation in the loops of your bowel consistent with enteritis.  I have given you a prescription for an antibiotic for you to take as prescribed in its entirety for management of this diagnosis.  Have also given you medication Zofran to help with any nausea or vomiting.  I recommend following up with your primary care doctor for continued evaluation and management of your symptoms.  Return if you continue to have pain or have nausea/vomiting unable to be controlled with the Zofran, or development of any new or worsening symptoms.

## 2022-03-26 NOTE — ED Provider Notes (Signed)
Care assumed from [facility], PA-C at shift change. Please see their note for further information.   Briefly: Patient presents today with complaints of bilateral flank pain radiating to her abdomen. Symptoms started this morning. Does endorse that she had nausea and vomiting yesterday, none today. She has had normal stools and is passing flatus. History of hysterectomy. No history of similar symptoms previously. No urinary symptoms  Ddx: viral illness, nephrolithiasis, musculoskeletal pain.   Plan: pending CT to determine dispo. Labs benign  Ct renal stone study resulted and reveals  1. Multiple loops of fluid-filled borderline to mildly distended small bowel without well-defined transition point, findings are suggestive of ileus or enteritis. 2. Punctate nonobstructing right kidney stone. 3. Diverticular disease of the left colon without acute inflammatory process.  I have personally reviewed and interpreted this imaging and agree with radiology interpretation.  Patient is nontoxic, nonseptic appearing, in no apparent distress.  Patient's pain and other symptoms adequately managed in emergency department.  Labs, imaging and vitals reviewed.  Patient does not meet the SIRS or Sepsis criteria.  On repeat exam patient does not have a surgical abdomin and there are no peritoneal signs.  No indication of appendicitis, bowel perforation, cholecystitis, diverticulitis, PID or ectopic pregnancy.  Upon reassessment, patients pain has significantly improved. She is able to eat and drink without nausea, vomiting, or pain. She would like to go home. Will give Augmentin for enteritis based on CT imaging. Very low suspicion for bowel obstruction based on clinical picture. Patient discharged home with Zofran for symptomatic treatment and given strict instructions for follow-up with their primary care physician.  I have also discussed reasons to return immediately to the ER.  Patient expresses understanding  and agrees with plan. Patient discharged in stable condition.  Findings and plan of care discussed with supervising physician Dr. Rogene Houston who is in agreement.       Nestor Lewandowsky 03/26/22 2110    Fredia Sorrow, MD 03/28/22 [phone]

## 2022-03-26 NOTE — ED Triage Notes (Signed)
Pt reports generalized body aches and flank pain. Pt reports recently feeling fatigued and went to PCP and diagnosed with low iron. Pt reports she was supposed to get iron infusion today but reports feeling too much pain and fatigue to go.

## 2022-03-26 NOTE — ED Notes (Signed)
Pt requesting IV removed due to pain at site

## 2022-03-26 NOTE — ED Provider Notes (Signed)
[facility] Provider Note   CSN: [id number] Arrival date & time: 03/26/22  1303     History  Chief Complaint  Patient presents with   Flank Pain   Generalized Body Aches    Paige Hamilton is a 42 y.o. female with a past medical history of fibromyalgia presenting today due to body aches.  She reports that she is currently being treated for low iron and was supposed to have an infusion today but this morning she woke up with severe pain to her back and bilateral flanks that radiated around to her abdomen.  She came to the infusion and came to the emergency department.  No dysuria, hematuria, frequency or urgency.   Flank Pain       Home Medications Prior to Admission medications   Medication Sig Start Date End Date Taking? Authorizing Provider  acetaminophen (TYLENOL) 325 MG tablet Take 2 tablets (650 mg total) by mouth every 4 (four) hours as needed for mild pain, fever, headache or moderate pain. 02/04/21   Dwyane Dee, MD  Cholecalciferol (VITAMIN D) 50 MCG (2000 UT) tablet Take 2,000 Units by mouth daily.    [provider]  cyanocobalamin (,VITAMIN B-12,) 1000 MCG/ML injection Inject 1,000 mcg into the muscle every 14 (fourteen) days.    [provider]  DULoxetine (CYMBALTA) 60 MG capsule Take 60 mg by mouth 2 (two) times daily.    [provider]  ferrous sulfate 325 (65 FE) MG tablet Take 325 mg by mouth daily.    [provider]  fluticasone (FLONASE) 50 MCG/ACT nasal spray Place 2 sprays into both nostrils daily. 02/05/21   Dwyane Dee, MD  HYDROcodone bit-homatropine (HYCODAN) 5-1.5 MG/5ML syrup Take 5 mLs by mouth every 6 (six) hours as needed for cough. 02/12/22   Sherrill Raring, PA-C  ibuprofen (ADVIL) 200 MG tablet Take 2 tablets (400 mg total) by mouth every 8 (eight) hours as needed for fever, headache or mild pain. 02/04/21   Dwyane Dee, MD  Lysine 500 MG TABS Take 500 mg by mouth  2 (two) times daily.    [provider]  magnesium gluconate (MAGONATE) 500 MG tablet Take 500 mg by mouth daily.    [provider]  omeprazole (PRILOSEC) 20 MG capsule Take 20 mg by mouth daily.    [provider]  ondansetron (ZOFRAN) 4 MG tablet Take 1 tablet (4 mg total) by mouth every 6 (six) hours. 02/12/21   Prosperi, Christian H, PA-C  oseltamivir (TAMIFLU) 75 MG capsule Take 1 capsule (75 mg total) by mouth every 12 (twelve) hours. 02/03/21   Ezequiel Essex, MD  Spacer/Aero-Holding Chambers (AEROCHAMBER PLUS WITH MASK) inhaler Use as instructed 10/13/20   Charlesetta Shanks, MD  spironolactone (ALDACTONE) 50 MG tablet Take 50 mg by mouth daily. 11/20/20   [provider]  tirzepatide Darcel Bayley) 2.5 MG/0.5ML Pen Inject 2.5 mg into the skin once a week. 01/03/21   [provider]  tiZANidine (ZANAFLEX) 4 MG tablet Take 4 mg by mouth daily.    [provider]  dicyclomine (BENTYL) 20 MG tablet Take 1 tablet (20 mg total) by mouth 2 (two) times daily. 07/22/18 07/19/20  Gareth Morgan, MD  pantoprazole (PROTONIX) 20 MG tablet Take 1 tablet (20 mg total) by mouth 2 (two) times daily. 10/21/17 07/19/20  Mortis, Alvie Heidelberg I, PA-C  prochlorperazine (COMPAZINE) 10 MG tablet Take 1 tablet (10 mg total) by mouth 2 (two) times daily as needed for  nausea. 12/17/18 07/19/20  Maudie Flakes, MD      Allergies    Patient has no known allergies.    Review of Systems   Review of Systems  Genitourinary:  Positive for flank pain.    Physical Exam Updated Vital Signs BP (!) 134/119   Pulse 65   Temp 98.6 F (37 C)   Resp (!) 21   Ht 5' 8"$  (1.727 m)   Wt 88 kg   LMP 03/04/2022 (Exact Date)   SpO2 95%   BMI 29.50 kg/m  Physical Exam Vitals and nursing note reviewed.  Constitutional:      General: She is not in acute distress.    Appearance: Normal appearance. She is not ill-appearing.  HENT:     Head: Normocephalic and atraumatic.  Eyes:      General: No scleral icterus.    Conjunctiva/sclera: Conjunctivae normal.  Pulmonary:     Effort: Pulmonary effort is normal. No respiratory distress.     Breath sounds: No wheezing.  Abdominal:     General: There is no distension.     Tenderness: There is no abdominal tenderness. There is right CVA tenderness and left CVA tenderness.  Skin:    Findings: No rash.  Neurological:     Mental Status: She is alert.  Psychiatric:        Mood and Affect: Mood normal.     ED Results / Procedures / Treatments   Labs (all labs ordered are listed, but only abnormal results are displayed) Labs Reviewed  CBC WITH DIFFERENTIAL/PLATELET - Abnormal; Notable for the following components:      Result Value   Hemoglobin 11.7 (*)    All other components within normal limits  BASIC METABOLIC PANEL - Abnormal; Notable for the following components:   Potassium 3.4 (*)    Glucose, Bld 102 (*)    Calcium 8.1 (*)    All other components within normal limits  URINALYSIS, ROUTINE W REFLEX MICROSCOPIC - Abnormal; Notable for the following components:   APPearance HAZY (*)    Bilirubin Urine SMALL (*)    Protein, ur 30 (*)    Leukocytes,Ua SMALL (*)    All other components within normal limits  URINALYSIS, MICROSCOPIC (REFLEX) - Abnormal; Notable for the following components:   Bacteria, UA RARE (*)    All other components within normal limits  RESP PANEL BY RT-PCR (RSV, FLU A&B, COVID)  RVPGX2  PREGNANCY, URINE    EKG None  Radiology No results found.  Procedures Procedures   Medications Ordered in ED Medications  ketorolac (TORADOL) 15 MG/ML injection 15 mg (15 mg Intravenous Given 03/26/22 1730)  sodium chloride 0.9 % bolus 500 mL (500 mLs Intravenous New Bag/Given 03/26/22 1800)    ED Course/ Medical Decision Making/ A&P                             Medical Decision Making Amount and/or Complexity of Data Reviewed Labs: ordered. Radiology: ordered.  Risk Prescription drug  management.   42 year old female presenting today with generalized bodyaches.  Started this morning.  Mostly in her back and bilateral flanks.  Differential includes but is not limited to viral illness, nephrolithiasis, musculoskeletal pain.This is not an exhaustive differential.    Past Medical History / Co-morbidities / Social History: Fibromyalgia at baseline   Additional history: Patient follows with family medicine with [facility].   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  The pertinent results include: Benign Some leukocytes and WBCs in the urine.  She denies any urinary symptoms.   Imaging Studies: I ordered and independently visualized and interpreted CT renal.  The scan is pending at this time.  Medications: I ordered medication including IVF and toradol   MDM/Disposition: This is a 42 year old female who presented today due to flank pain and overall fatigue.  Started this morning.  She was supposed to get an iron infusion but this did not happen because she was in too much pain.  Lab work benign.  She does have some WBCs in her UA but denies any urinary symptoms.  CT renal pending.  Patient signed out to PA, Paige Hamilton.  Please see her note for results and dispo.  Suspect patient will be discharged home with NSAIDs and Tylenol for musculoskeletal pain.  She has a kidney stone she will potentially need Flomax and urology follow-up.  Final Clinical Impression(s) / ED Diagnoses Final diagnoses:  None    Rx / DC Orders    Rhae Hammock, PA-C 03/26/22 1859    Fredia Sorrow, MD 03/28/22 [phone]

## 2022-05-21 IMAGING — CT CT ANGIO CHEST
2 of 8 series · 18 of 36 positions shown · IV contrast (Omnipaque)
Comparison: Chest radiograph dated 02/03/2021.

CLINICAL DATA: Concern for pulmonary embolism.

EXAM:
CT ANGIOGRAPHY CHEST WITH CONTRAST
TECHNIQUE: Multidetector CT imaging of the chest was performed using the
standard protocol during bolus administration of intravenous
contrast. Multiplanar CT image reconstructions and MIPs were
obtained to evaluate the vascular anatomy.
CONTRAST:  100mL OMNIPAQUE IOHEXOL 350 MG/ML SOLN

[Series 6: pe coronal mpr · coronal · 0.59mm/px · 1 of 175 slices shown]
[im 88/175  mediastinal]
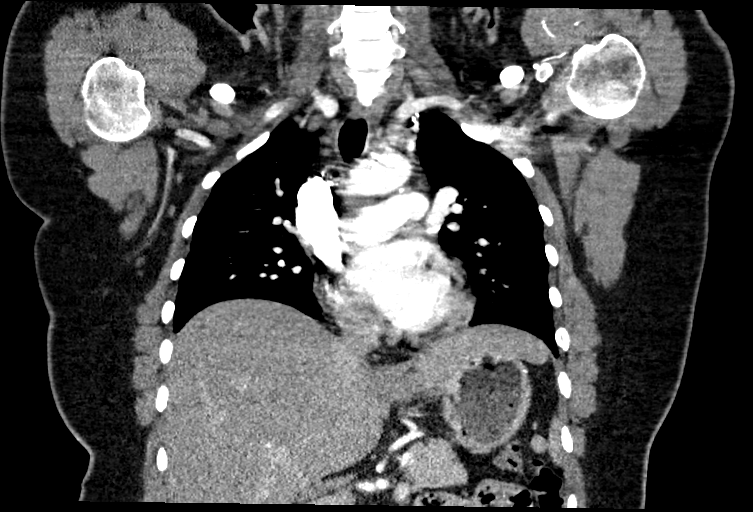

[Series 10: pe thins · axial · 0.91mm/px · z∈[-223,+39]mm · 17 of 294 slices shown]
[im 16/294  lung]
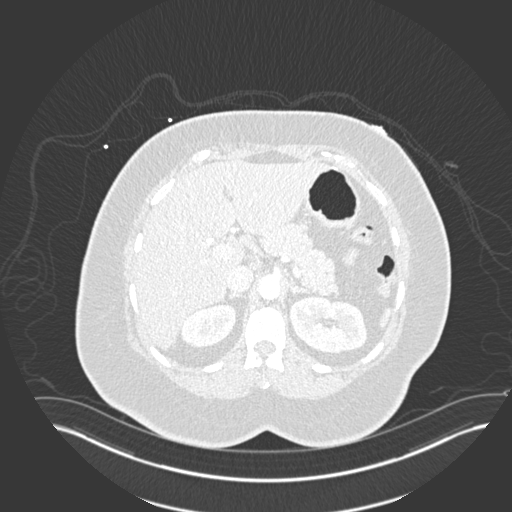
[im 31/294  mediastinal]
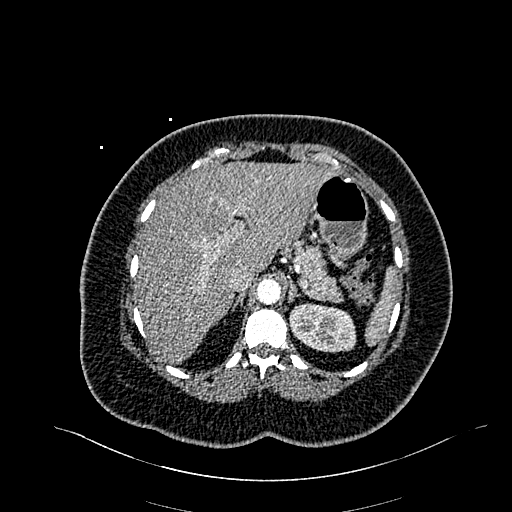
[im 47/294  lung]
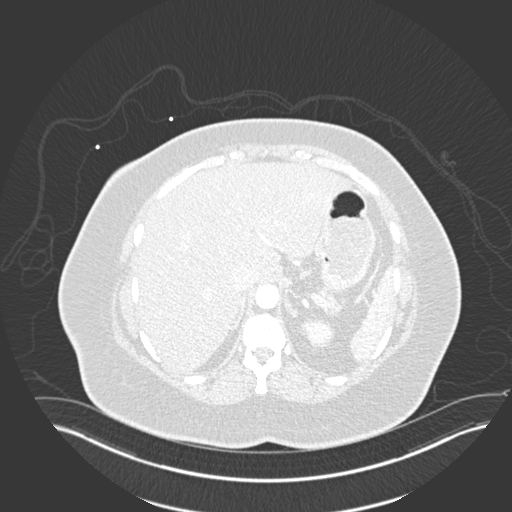
[im 62/294  mediastinal]
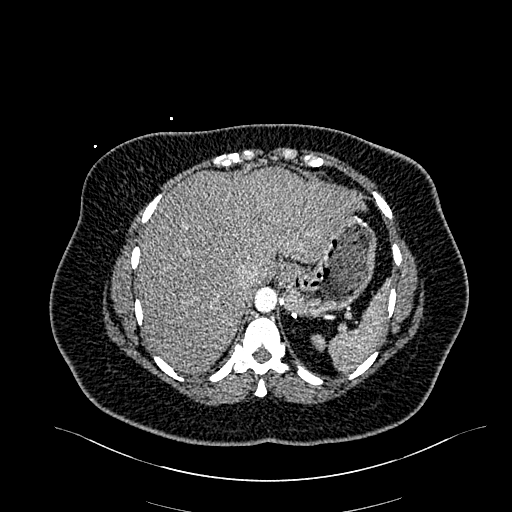
[im 78/294  lung]
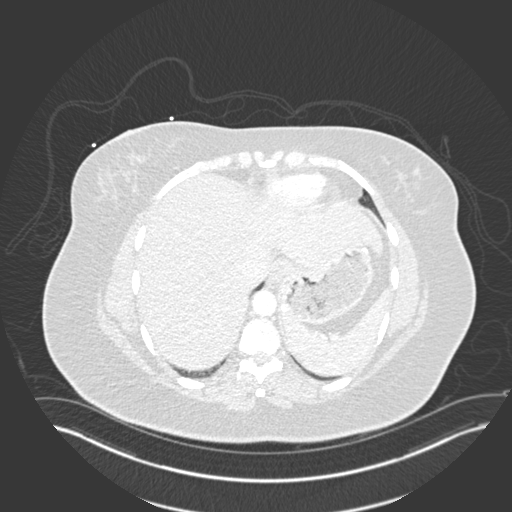
[im 93/294  mediastinal]
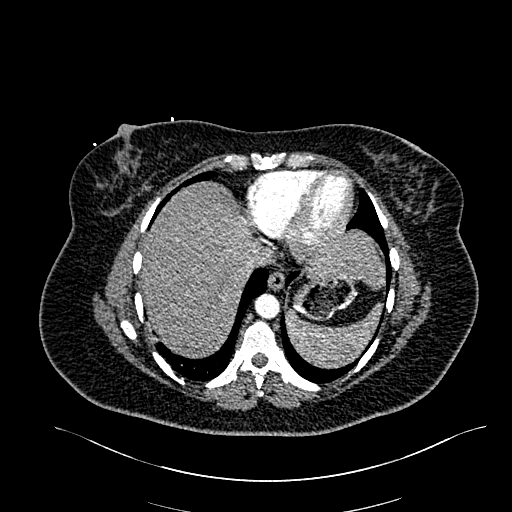
[im 108/294  lung]
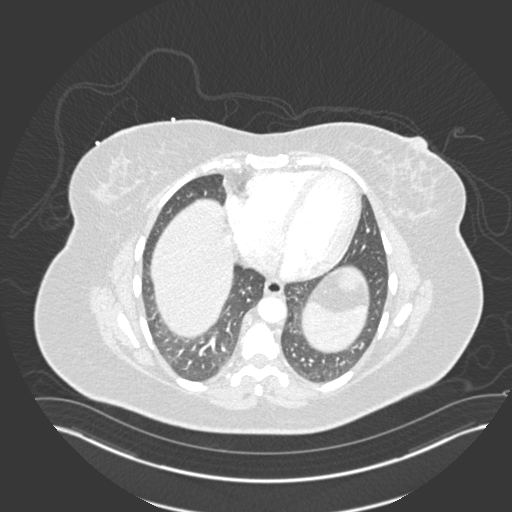
[im 124/294  mediastinal]
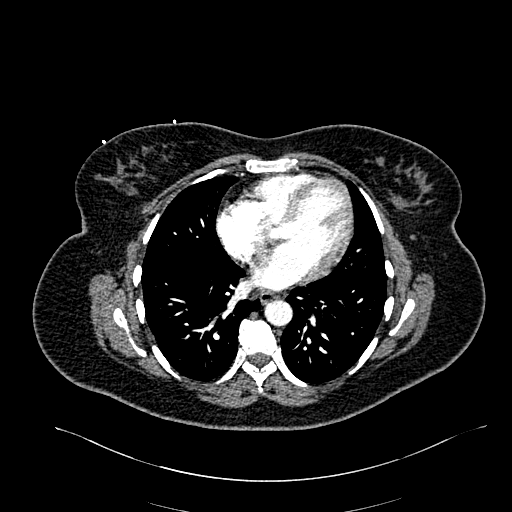
[im 155/294  lung]
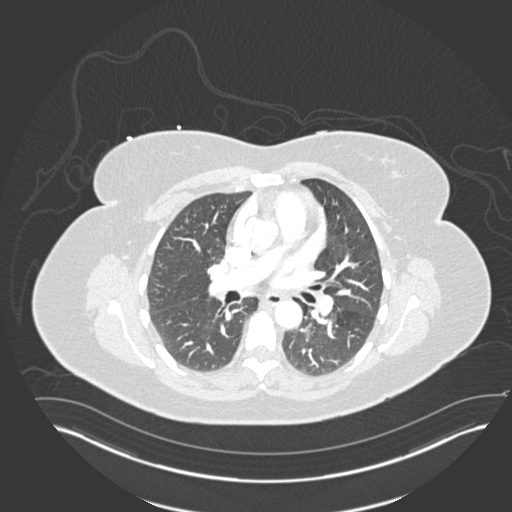
[im 170/294  mediastinal]
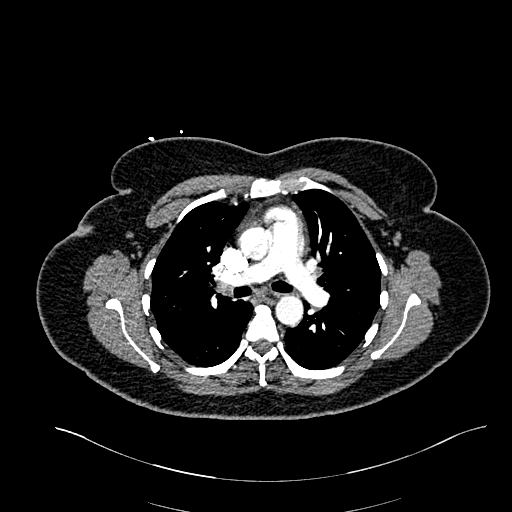
[im 186/294  lung]
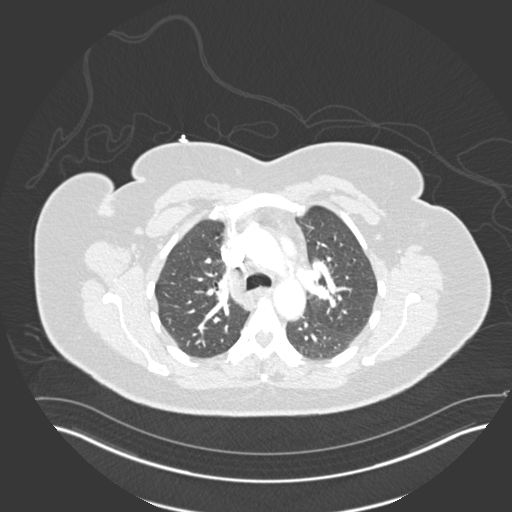
[im 201/294  mediastinal]
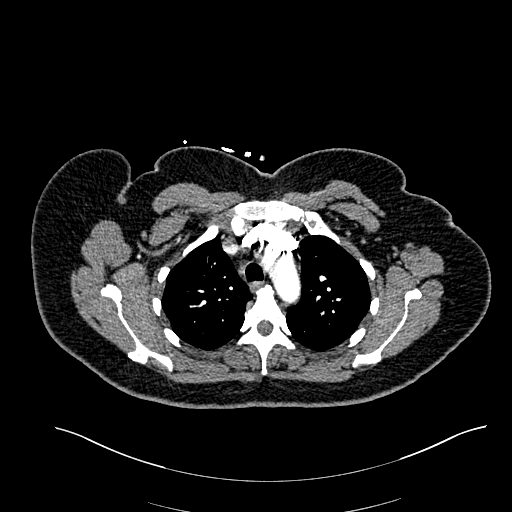
[im 216/294  lung]
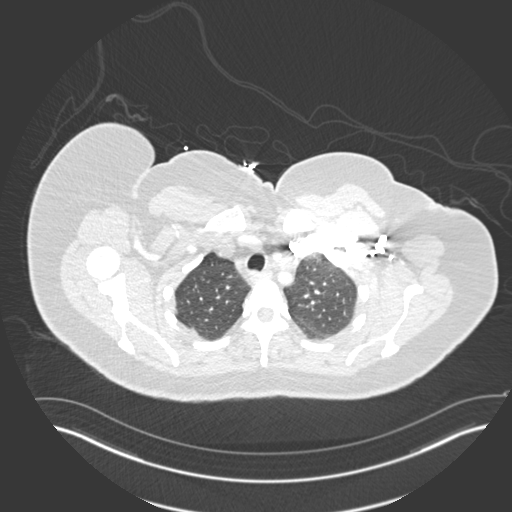
[im 232/294  mediastinal]
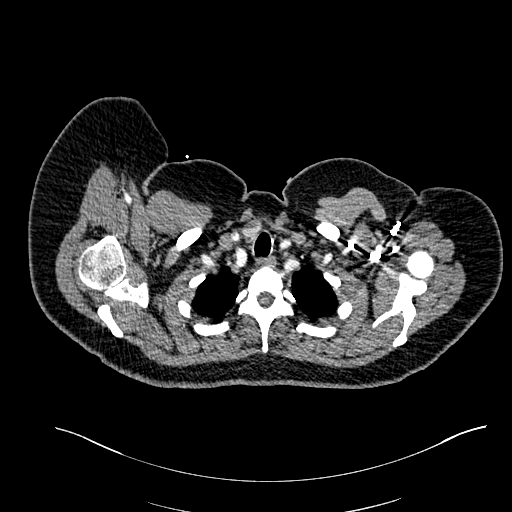
[im 247/294  lung]
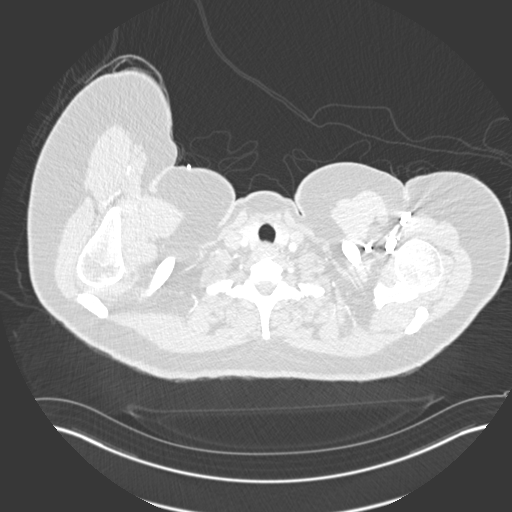
[im 263/294  mediastinal]
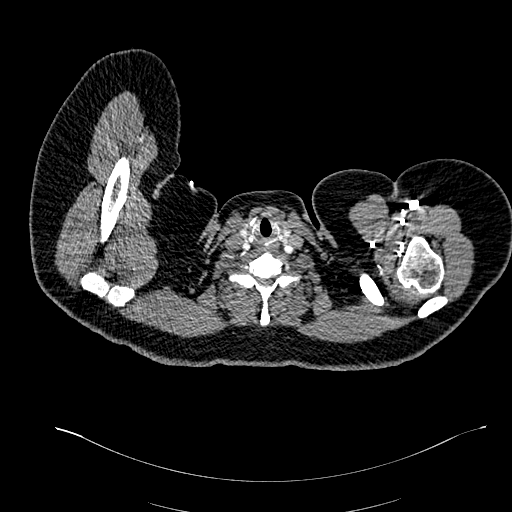
[im 278/294  lung]
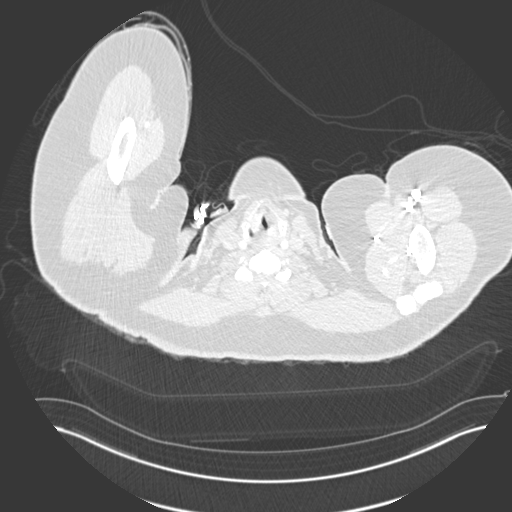

[18 of 36 positions shown; findings below may reference images not displayed]

FINDINGS: Evaluation of this exam is limited due to respiratory motion
artifact.

Cardiovascular: There is no cardiomegaly or pericardial effusion.
The thoracic aorta is unremarkable. The origins of the great vessels
of the aortic arch appear patent. Evaluation of the pulmonary
arteries is limited due to respiratory motion artifact and
suboptimal opacification and timing of the contrast. No definite
pulmonary artery embolus identified.

Mediastinum/Nodes: No hilar or mediastinal adenopathy. The esophagus
is grossly unremarkable. No mediastinal fluid collection.

Lungs/Pleura: The lungs are clear. There is no pleural effusion or
pneumothorax. The central airways are patent.

Upper Abdomen: Fatty liver. Cholecystectomy. Postsurgical changes of
the stomach.

Musculoskeletal: Mild degenerative changes. No acute osseous
pathology.

Review of the MIP images confirms the above findings.
IMPRESSION: 1. No acute intrathoracic pathology. No CT evidence of pulmonary
embolism.
2. Fatty liver.

## 2022-05-30 IMAGING — CR DG CHEST 2V
2 series · 2 of 2 positions shown · non-contrast
Comparison: February 03, 2021.

CLINICAL DATA: Chest pain.

EXAM:
CHEST - 2 VIEW

[w chest pa]
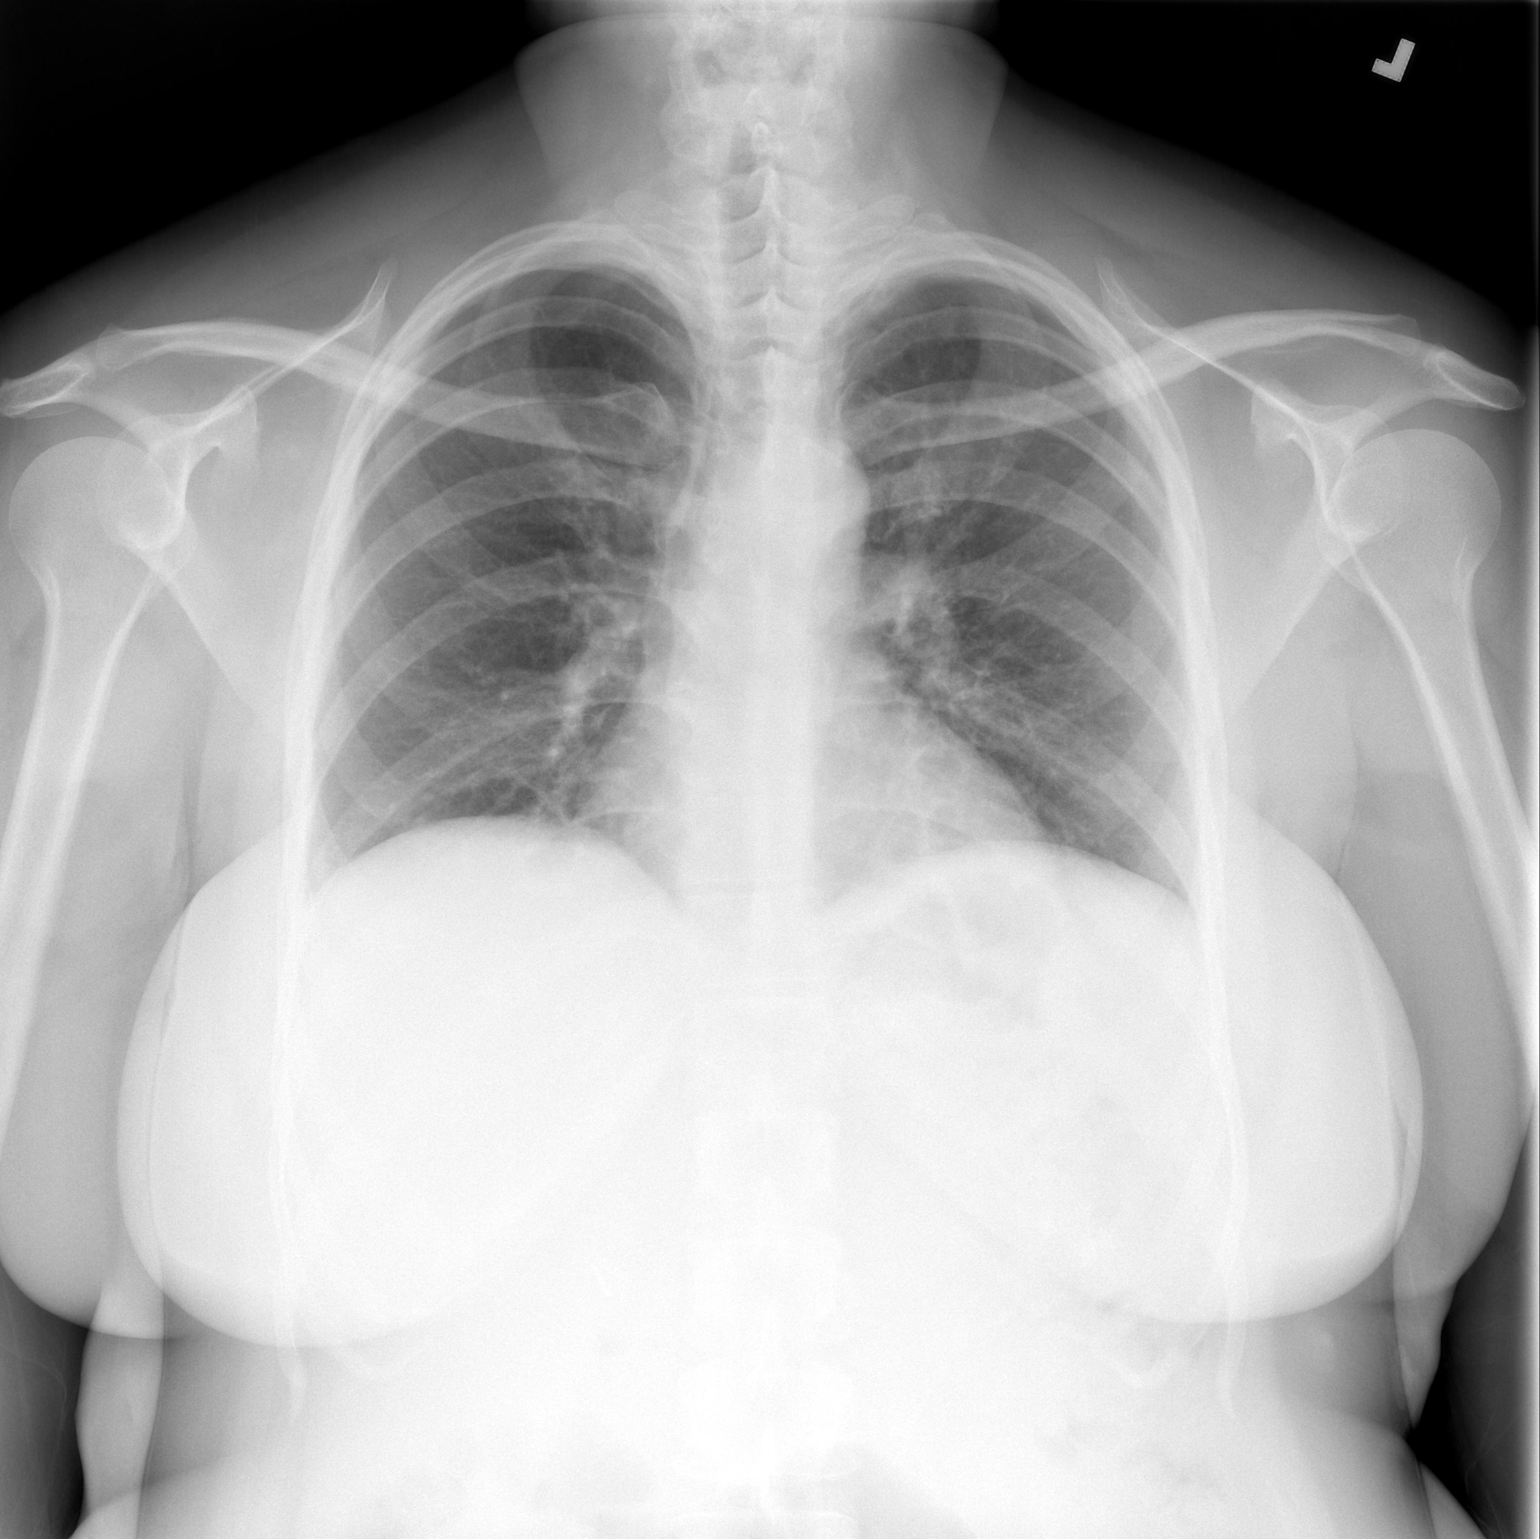

[w chest lat]
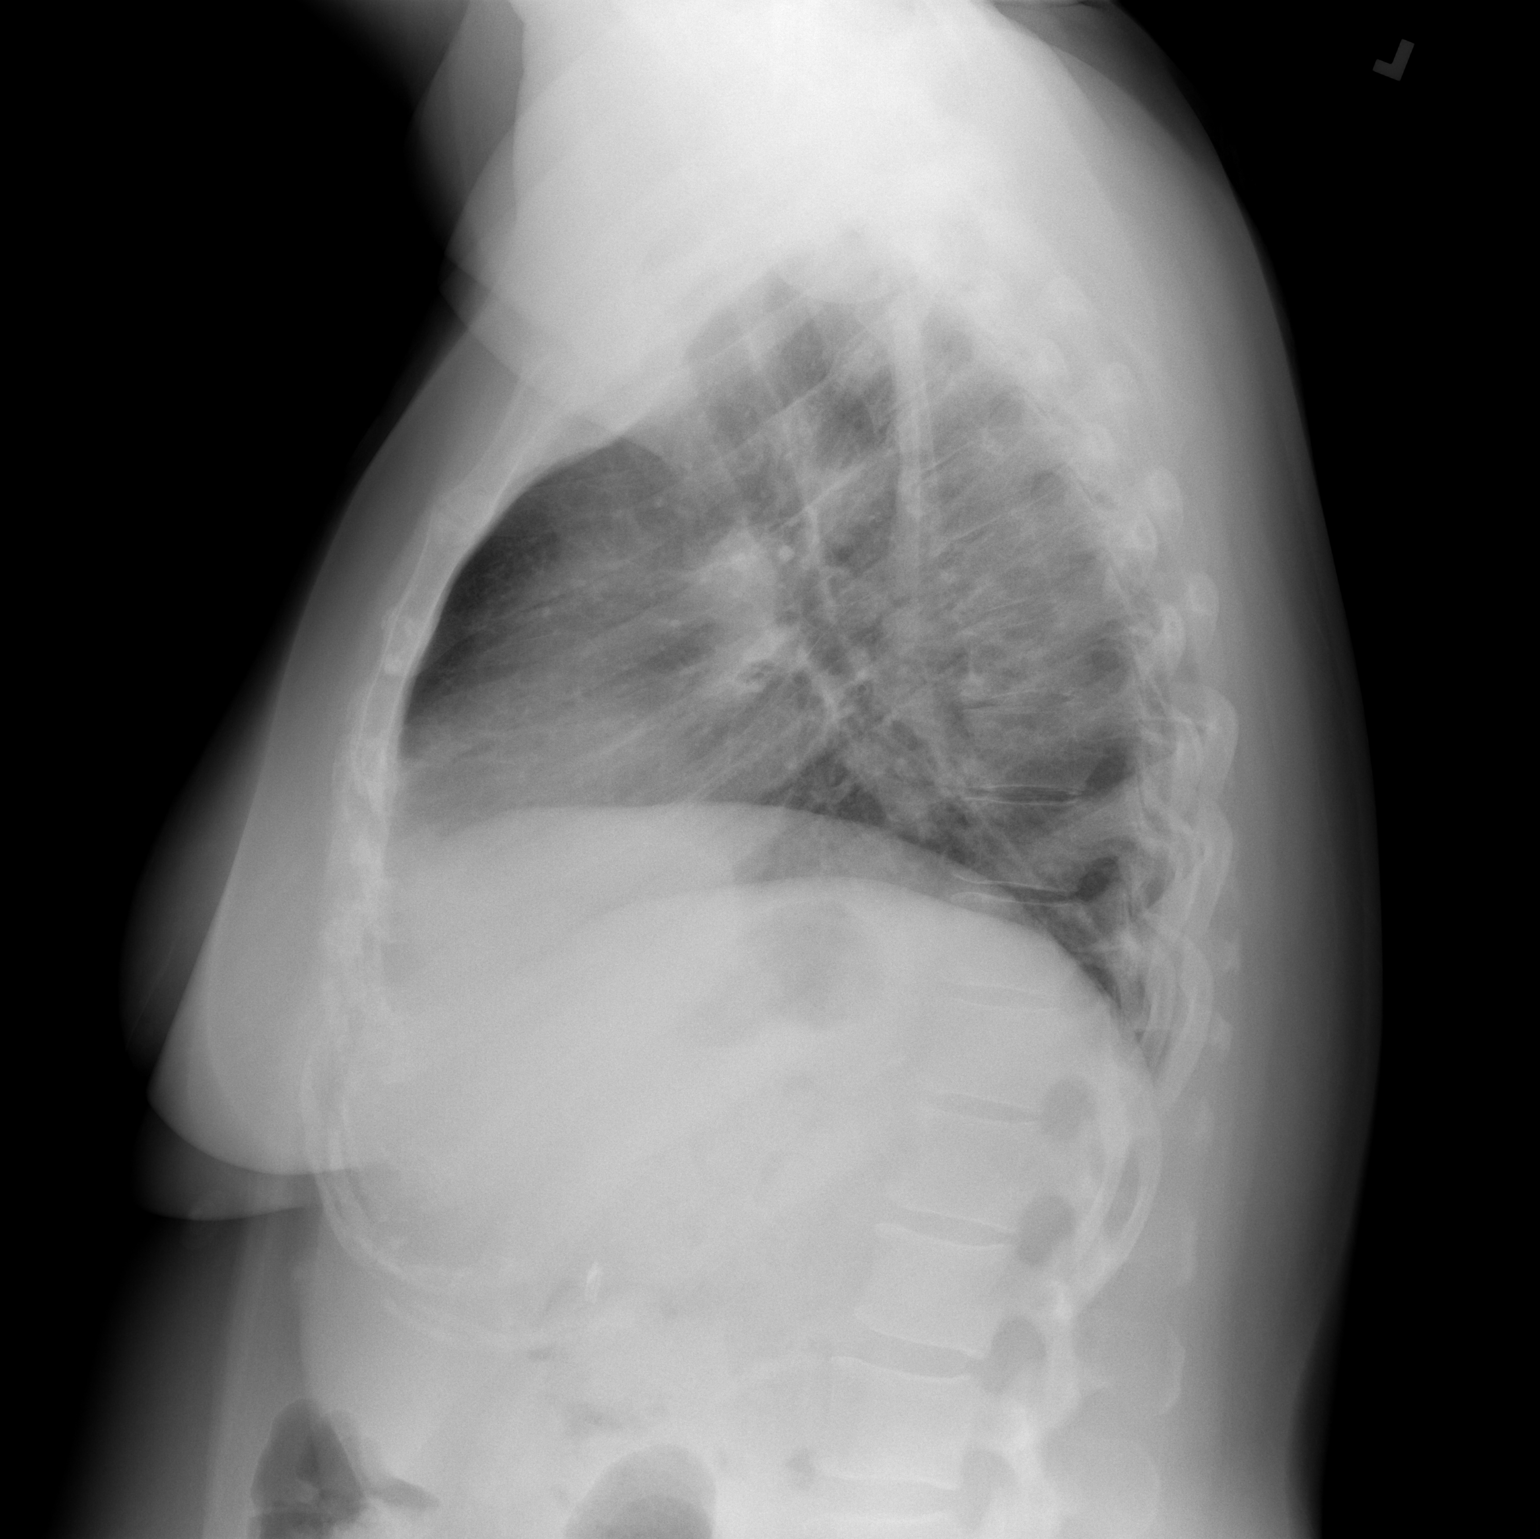

[2 of 2 positions shown; findings below may reference images not displayed]

FINDINGS: The heart size and mediastinal contours are within normal limits.
Hypoinflation of the lungs with minimal bibasilar subsegmental
atelectasis. The visualized skeletal structures are unremarkable.
IMPRESSION: Hypoinflation of the lungs with minimal bibasilar subsegmental
atelectasis.
